# Patient Record
Sex: Female | Born: 1985 | Race: Black or African American | Hispanic: No | Marital: Married | Smoking: Never smoker
Health system: Southern US, Community
[De-identification: ages and names within clinical notes are randomized; demographics above are authoritative.]

## PROBLEM LIST (undated history)

## (undated) DIAGNOSIS — R5383 Other fatigue: Secondary | ICD-10-CM

## (undated) DIAGNOSIS — D573 Sickle-cell trait: Secondary | ICD-10-CM

## (undated) DIAGNOSIS — R51 Headache: Secondary | ICD-10-CM

## (undated) DIAGNOSIS — F40240 Claustrophobia: Secondary | ICD-10-CM

## (undated) DIAGNOSIS — R002 Palpitations: Secondary | ICD-10-CM

## (undated) DIAGNOSIS — R519 Headache, unspecified: Secondary | ICD-10-CM

## (undated) DIAGNOSIS — N6310 Unspecified lump in the right breast, unspecified quadrant: Secondary | ICD-10-CM

## (undated) HISTORY — DX: Other fatigue: R53.83

## (undated) HISTORY — PX: BREAST LUMPECTOMY: SHX2

## (undated) HISTORY — DX: Palpitations: R00.2

---

## 2010-09-16 LAB — RPR: RPR: NONREACTIVE

## 2010-09-16 LAB — HEPATITIS B SURFACE ANTIGEN: Hepatitis B Surface Ag: NEGATIVE

## 2011-01-28 NOTE — L&D Delivery Note (Signed)
Delivery Note At 3:52 PM a viable, healthy and SGA female was delivered via Vaginal, Spontaneous Delivery (Presentation: Occiput Anterior).  APGAR: 8, 9; weight 5 lb 13 oz (2635 g).   Placenta status: Intact, Spontaneous.  Cord: 3 vessels. Cervix without lacerations.  Anesthesia: Epidural  Episiotomy: None Lacerations: 2nd degree;Perineal Suture Repair: 3.0 chromic Est. Blood Loss (mL): 400  Mom to postpartum.  Baby to nursery-stable.  Gloria Yu D 03/22/2011, 8:06 PM

## 2011-03-22 ENCOUNTER — Encounter (HOSPITAL_COMMUNITY): Payer: Self-pay

## 2011-03-22 ENCOUNTER — Inpatient Hospital Stay (HOSPITAL_COMMUNITY): Payer: Medicaid Other | Admitting: Anesthesiology

## 2011-03-22 ENCOUNTER — Encounter (HOSPITAL_COMMUNITY): Payer: Self-pay | Admitting: Anesthesiology

## 2011-03-22 ENCOUNTER — Inpatient Hospital Stay (HOSPITAL_COMMUNITY)
Admission: AD | Admit: 2011-03-22 | Discharge: 2011-03-22 | Disposition: A | Discharge status: Home Health | Payer: Medicaid Other | Source: Ambulatory Visit | Attending: Obstetrics & Gynecology | Admitting: Obstetrics & Gynecology

## 2011-03-22 ENCOUNTER — Inpatient Hospital Stay (HOSPITAL_COMMUNITY)
Admission: AD | Admit: 2011-03-22 | Discharge: 2011-03-24 | DRG: 775 | Disposition: A | Discharge status: Home / Self Care | Payer: Medicaid Other | Source: Ambulatory Visit | Attending: Obstetrics & Gynecology | Admitting: Obstetrics & Gynecology

## 2011-03-22 DIAGNOSIS — O365990 Maternal care for other known or suspected poor fetal growth, unspecified trimester, not applicable or unspecified: Secondary | ICD-10-CM | POA: Diagnosis present

## 2011-03-22 DIAGNOSIS — O479 False labor, unspecified: Secondary | ICD-10-CM | POA: Insufficient documentation

## 2011-03-22 HISTORY — DX: Sickle-cell trait: D57.3

## 2011-03-22 HISTORY — DX: Unspecified lump in the right breast, unspecified quadrant: N63.10

## 2011-03-22 LAB — CBC
Hemoglobin: 13.3 g/dL (ref 12.0–15.0)
MCH: 28.7 pg (ref 26.0–34.0)
MCHC: 35.9 g/dL (ref 30.0–36.0)
RDW: 14.6 % (ref 11.5–15.5)

## 2011-03-22 LAB — RPR: RPR Ser Ql: NONREACTIVE

## 2011-03-22 MED ORDER — ZOLPIDEM TARTRATE 5 MG PO TABS: 5.0000 mg | ORAL_TABLET | Freq: Every evening | ORAL | Status: DC | PRN

## 2011-03-22 MED ORDER — EPHEDRINE 5 MG/ML INJ: 10.0000 mg | INTRAVENOUS | Status: DC | PRN

## 2011-03-22 MED ORDER — PHENYLEPHRINE 40 MCG/ML (10ML) SYRINGE FOR IV PUSH (FOR BLOOD PRESSURE SUPPORT): 80.0000 ug | PREFILLED_SYRINGE | INTRAVENOUS | Status: DC | PRN

## 2011-03-22 MED ORDER — SENNOSIDES-DOCUSATE SODIUM 8.6-50 MG PO TABS
2.0000 | ORAL_TABLET | Freq: Every day | ORAL | Status: DC
  Administered 2011-03-22: 2 via ORAL

## 2011-03-22 MED ORDER — TERBUTALINE SULFATE 1 MG/ML IJ SOLN: 0.2500 mg | Freq: Once | INTRAMUSCULAR | Status: DC | PRN

## 2011-03-22 MED ORDER — ONDANSETRON HCL 4 MG PO TABS: 4.0000 mg | ORAL_TABLET | ORAL | Status: DC | PRN

## 2011-03-22 MED ORDER — OXYTOCIN 20 UNITS IN LACTATED RINGERS INFUSION - SIMPLE
1.0000 m[IU]/min | INTRAVENOUS | Status: DC
  Administered 2011-03-22: 2 m[IU]/min via INTRAVENOUS

## 2011-03-22 MED ORDER — LACTATED RINGERS IV SOLN
500.0000 mL | INTRAVENOUS | Status: DC | PRN
  Administered 2011-03-22: 750 mL via INTRAVENOUS

## 2011-03-22 MED ORDER — IBUPROFEN 600 MG PO TABS: 600.0000 mg | ORAL_TABLET | Freq: Four times a day (QID) | ORAL | Status: DC | PRN

## 2011-03-22 MED ORDER — OXYTOCIN 20 UNITS IN LACTATED RINGERS INFUSION - SIMPLE
125.0000 mL/h | Freq: Once | INTRAVENOUS | Status: AC
  Administered 2011-03-22: 20 [IU] via INTRAVENOUS

## 2011-03-22 MED ORDER — OXYTOCIN BOLUS FROM INFUSION
500.0000 mL | Freq: Once | INTRAVENOUS | Status: DC
  Filled 2011-03-22: qty 1000
  Filled 2011-03-22: qty 500

## 2011-03-22 MED ORDER — EPHEDRINE 5 MG/ML INJ
10.0000 mg | INTRAVENOUS | Status: DC | PRN
  Filled 2011-03-22: qty 4

## 2011-03-22 MED ORDER — FLEET ENEMA 7-19 GM/118ML RE ENEM: 1.0000 | ENEMA | RECTAL | Status: DC | PRN

## 2011-03-22 MED ORDER — ACETAMINOPHEN 325 MG PO TABS: 650.0000 mg | ORAL_TABLET | ORAL | Status: DC | PRN

## 2011-03-22 MED ORDER — FENTANYL 2.5 MCG/ML BUPIVACAINE 1/10 % EPIDURAL INFUSION (WH - ANES)
INTRAMUSCULAR | Status: DC | PRN
  Administered 2011-03-22: 14 mL/h via EPIDURAL

## 2011-03-22 MED ORDER — PRENATAL MULTIVITAMIN CH
1.0000 | ORAL_TABLET | Freq: Every day | ORAL | Status: DC
  Administered 2011-03-23 – 2011-03-24 (×3): 1 via ORAL
  Filled 2011-03-22 (×2): qty 1

## 2011-03-22 MED ORDER — PENICILLIN G POTASSIUM 5000000 UNITS IJ SOLR
5.0000 10*6.[IU] | Freq: Once | INTRAVENOUS | Status: AC
  Administered 2011-03-22: 5 10*6.[IU] via INTRAVENOUS
  Filled 2011-03-22: qty 5

## 2011-03-22 MED ORDER — DIPHENHYDRAMINE HCL 25 MG PO CAPS: 25.0000 mg | ORAL_CAPSULE | Freq: Four times a day (QID) | ORAL | Status: DC | PRN

## 2011-03-22 MED ORDER — DIBUCAINE 1 % RE OINT: 1.0000 "application " | TOPICAL_OINTMENT | RECTAL | Status: DC | PRN

## 2011-03-22 MED ORDER — WITCH HAZEL-GLYCERIN EX PADS: 1.0000 "application " | MEDICATED_PAD | CUTANEOUS | Status: DC | PRN

## 2011-03-22 MED ORDER — BENZOCAINE-MENTHOL 20-0.5 % EX AERO
1.0000 "application " | INHALATION_SPRAY | CUTANEOUS | Status: DC | PRN
  Administered 2011-03-22: 1 via TOPICAL

## 2011-03-22 MED ORDER — LIDOCAINE HCL 1.5 % IJ SOLN
INTRAMUSCULAR | Status: DC | PRN
  Administered 2011-03-22 (×2): 5 mL via EPIDURAL

## 2011-03-22 MED ORDER — LIDOCAINE HCL (PF) 1 % IJ SOLN
30.0000 mL | INTRAMUSCULAR | Status: DC | PRN
  Filled 2011-03-22: qty 30

## 2011-03-22 MED ORDER — TETANUS-DIPHTH-ACELL PERTUSSIS 5-2.5-18.5 LF-MCG/0.5 IM SUSP
0.5000 mL | Freq: Once | INTRAMUSCULAR | Status: AC
  Administered 2011-03-23: 0.5 mL via INTRAMUSCULAR
  Filled 2011-03-22 (×2): qty 0.5

## 2011-03-22 MED ORDER — ONDANSETRON HCL 4 MG/2ML IJ SOLN: 4.0000 mg | INTRAMUSCULAR | Status: DC | PRN

## 2011-03-22 MED ORDER — LACTATED RINGERS IV SOLN: 500.0000 mL | Freq: Once | INTRAVENOUS | Status: DC

## 2011-03-22 MED ORDER — OXYCODONE-ACETAMINOPHEN 5-325 MG PO TABS: 1.0000 | ORAL_TABLET | ORAL | Status: DC | PRN

## 2011-03-22 MED ORDER — PENICILLIN G POTASSIUM 5000000 UNITS IJ SOLR
2.5000 10*6.[IU] | INTRAVENOUS | Status: DC
  Administered 2011-03-22: 2.5 10*6.[IU] via INTRAVENOUS
  Filled 2011-03-22 (×5): qty 2.5

## 2011-03-22 MED ORDER — IBUPROFEN 600 MG PO TABS
600.0000 mg | ORAL_TABLET | Freq: Four times a day (QID) | ORAL | Status: DC
  Administered 2011-03-22 – 2011-03-24 (×7): 600 mg via ORAL
  Filled 2011-03-22 (×6): qty 1

## 2011-03-22 MED ORDER — FENTANYL 2.5 MCG/ML BUPIVACAINE 1/10 % EPIDURAL INFUSION (WH - ANES)
14.0000 mL/h | INTRAMUSCULAR | Status: DC
  Administered 2011-03-22: 14 mL/h via EPIDURAL
  Filled 2011-03-22 (×2): qty 60

## 2011-03-22 MED ORDER — DIPHENHYDRAMINE HCL 50 MG/ML IJ SOLN: 12.5000 mg | INTRAMUSCULAR | Status: DC | PRN

## 2011-03-22 MED ORDER — OXYCODONE-ACETAMINOPHEN 5-325 MG PO TABS
1.0000 | ORAL_TABLET | ORAL | Status: DC | PRN
  Administered 2011-03-22 – 2011-03-24 (×4): 1 via ORAL
  Filled 2011-03-22 (×4): qty 1

## 2011-03-22 MED ORDER — BENZOCAINE-MENTHOL 20-0.5 % EX AERO
INHALATION_SPRAY | CUTANEOUS | Status: AC
  Administered 2011-03-22: 1 via TOPICAL
  Filled 2011-03-22: qty 56

## 2011-03-22 MED ORDER — LACTATED RINGERS IV SOLN
INTRAVENOUS | Status: DC
  Administered 2011-03-22: 11:00:00 via INTRAVENOUS

## 2011-03-22 MED ORDER — BUTORPHANOL TARTRATE 2 MG/ML IJ SOLN
1.0000 mg | INTRAMUSCULAR | Status: DC | PRN
  Administered 2011-03-22: 1 mg via INTRAVENOUS
  Filled 2011-03-22: qty 1

## 2011-03-22 MED ORDER — SIMETHICONE 80 MG PO CHEW: 80.0000 mg | CHEWABLE_TABLET | ORAL | Status: DC | PRN

## 2011-03-22 MED ORDER — LANOLIN HYDROUS EX OINT: TOPICAL_OINTMENT | CUTANEOUS | Status: DC | PRN

## 2011-03-22 MED ORDER — ONDANSETRON HCL 4 MG/2ML IJ SOLN: 4.0000 mg | Freq: Four times a day (QID) | INTRAMUSCULAR | Status: DC | PRN

## 2011-03-22 MED ORDER — CITRIC ACID-SODIUM CITRATE 334-500 MG/5ML PO SOLN: 30.0000 mL | ORAL | Status: DC | PRN

## 2011-03-22 MED ORDER — PHENYLEPHRINE 40 MCG/ML (10ML) SYRINGE FOR IV PUSH (FOR BLOOD PRESSURE SUPPORT)
80.0000 ug | PREFILLED_SYRINGE | INTRAVENOUS | Status: DC | PRN
  Filled 2011-03-22: qty 5

## 2011-03-22 NOTE — Progress Notes (Signed)
Per Dr Arlyce Dice, no amniotic bag felt, attempted, but unable to AROM. Scant amt of clear fluid noted.  Will assume that pt had, in-fact, SROM'd last night around 2230.

## 2011-03-22 NOTE — H&P (Signed)
26 y.o. G2P0010  Estimated Date of Delivery: 03/22/11 admitted at [redacted] weeks gestation in early labor. Prenatal course was uncomplicated. Prenatal labs: Blood Type:A+.  Screening tests for HIV, Syphilis, Hepatitis B, Rubella sensitivity, fetal anomalies, and gestational diabetes were negative.   Perineal group B strep colonization was noted in her first trimester urine screen.  Afebrile, BP 140/90 range. Heart and Lungs: No active disease Abdomen: soft, gravid, EFW AGA. Cervical exam:  2/80.  Impression: Term Preg., early labor, mild elevation of blood pressure  Plan: Labor augmentation with pitocin, GBS prophylaxis

## 2011-03-22 NOTE — Anesthesia Postprocedure Evaluation (Signed)
Anesthesia Post Note  Patient: Gloria Yu  Procedure(s) Performed: * No procedures listed *  Anesthesia type: Epidural  Patient location: Mother/Baby  Post pain: Pain level controlled  Post assessment: Post-op Vital signs reviewed  Last Vitals:  Filed Vitals:   03/22/11 1840  BP: 109/74  Pulse: 95  Temp: 36.8 C  Resp: 18    Post vital signs: Reviewed  Level of consciousness: awake  Complications: No apparent anesthesia complications

## 2011-03-22 NOTE — Progress Notes (Signed)
Rec'd report & assumed pt care 

## 2011-03-22 NOTE — Progress Notes (Signed)
Dr Arlyce Dice notified of patient c/o painful contractions, nausea, tracing, ctx pattern, sve result and bp readings. He will place order to admit to l/d unit

## 2011-03-22 NOTE — Progress Notes (Signed)
Patient is here with c/o leaking fluids and mucus plug. Reports good fetal movement. Denies any vaginal bleeding

## 2011-03-22 NOTE — Progress Notes (Signed)
Patient returned with c/o abdominal pain (states;"possible conatrctions"), n/v and was unable to sleep since she left here. She reports good fetal movement, denies vaginal bleeding or lof.

## 2011-03-22 NOTE — Anesthesia Preprocedure Evaluation (Signed)
Anesthesia Evaluation  Patient identified by MRN, date of birth, ID band Patient awake    Reviewed: Allergy & Precautions, H&P , NPO status , Patient's Chart, lab work & pertinent test results  Airway Mallampati: III TM Distance: >3 FB Neck ROM: full    Dental No notable dental hx.    Pulmonary neg pulmonary ROS,    Pulmonary exam normal       Cardiovascular neg cardio ROS     Neuro/Psych Negative Neurological ROS  Negative Psych ROS   GI/Hepatic negative GI ROS, Neg liver ROS,   Endo/Other  Negative Endocrine ROS  Renal/GU negative Renal ROS  Genitourinary negative   Musculoskeletal negative musculoskeletal ROS (+)   Abdominal (+) obese,   Peds negative pediatric ROS (+)  Hematology negative hematology ROS (+)   Anesthesia Other Findings   Reproductive/Obstetrics (+) Pregnancy                           Anesthesia Physical Anesthesia Plan  ASA: III  Anesthesia Plan: Epidural   Post-op Pain Management:    Induction:   Airway Management Planned:   Additional Equipment:   Intra-op Plan:   Post-operative Plan:   Informed Consent: I have reviewed the patients History and Physical, chart, labs and discussed the procedure including the risks, benefits and alternatives for the proposed anesthesia with the patient or authorized representative who has indicated his/her understanding and acceptance.     Plan Discussed with:   Anesthesia Plan Comments:         Anesthesia Quick Evaluation

## 2011-03-22 NOTE — Progress Notes (Signed)
States she prefers to bottlefeed. On admission, said she planned to breastfeed.   When asked why she changed her mind, stated that she never wanted to breastfeed, but that she'd felt pressured to do so.  Reviewed benefits of breastmilk, which she was already aware of.  Still prefers to bottlefeed.

## 2011-03-22 NOTE — Progress Notes (Signed)
Notified of tracing, ctx pattern, sve result and negative fern. Order to discharge patient home after 2 15x15 accels.

## 2011-03-22 NOTE — Anesthesia Procedure Notes (Signed)
Epidural Patient location during procedure: OB Start time: 03/22/2011 10:00 AM End time: 03/22/2011 10:04 AM Reason for block: procedure for pain  Staffing Anesthesiologist: Sandrea Hughs Performed by: anesthesiologist   Preanesthetic Checklist Completed: patient identified, site marked, surgical consent, pre-op evaluation, timeout performed, IV checked, risks and benefits discussed and monitors and equipment checked  Epidural Patient position: sitting Prep: site prepped and draped and DuraPrep Patient monitoring: continuous pulse ox and blood pressure Approach: midline Injection technique: LOR air  Needle:  Needle type: Tuohy  Needle gauge: 17 G Needle length: 9 cm Needle insertion depth: 7 cm Catheter type: closed end flexible Catheter size: 19 Gauge Catheter at skin depth: 13 cm Test dose: negative and 1.5% lidocaine  Assessment Sensory level: T8 Events: blood not aspirated, injection not painful, no injection resistance, negative IV test and no paresthesia

## 2011-03-23 LAB — CBC
MCH: 28.2 pg (ref 26.0–34.0)
MCV: 80.5 fL (ref 78.0–100.0)
Platelets: 223 10*3/uL (ref 150–400)
RDW: 14.6 % (ref 11.5–15.5)
WBC: 10.6 10*3/uL — ABNORMAL HIGH (ref 4.0–10.5)

## 2011-03-23 NOTE — Progress Notes (Signed)
Post Partum Day 1 Subjective: no complaints  Objective: Blood pressure 96/64, pulse 94, temperature 98.4 F (36.9 C), temperature source Oral, resp. rate 20, height 5\' 3"  (1.6 m), weight 258 lb (117.028 kg), SpO2 98.00%, unknown if currently breastfeeding.  Physical Exam:  General: alert Lochia: appropriate Uterine Fundus: firm   Basename 03/23/11 0525 03/22/11 0848  HGB 11.1* 13.3  HCT 31.7* 37.0    Assessment/Plan: Plan for discharge tomorrow   LOS: 1 day   Estill Llerena D 03/23/2011, 9:47 AM

## 2011-03-23 NOTE — Anesthesia Postprocedure Evaluation (Signed)
  Anesthesia Post-op Note  Patient: Gloria Yu  Procedure(s) Performed: * No procedures listed *  Patient Location: Mother/Baby  Anesthesia Type: Epidural  Level of Consciousness: awake, alert  and oriented  Airway and Oxygen Therapy: Patient Spontanous Breathing  Post-op Pain: mild  Post-op Assessment: Post-op Vital signs reviewed  Post-op Vital Signs: stable  Complications: No apparent anesthesia complications

## 2011-03-23 NOTE — Addendum Note (Signed)
Addendum  created 03/23/11 1042 by Edison Pace, CRNA   Modules edited:Charges VN, Notes Section

## 2011-03-24 MED ORDER — IBUPROFEN 600 MG PO TABS: 600.0000 mg | ORAL_TABLET | Freq: Four times a day (QID) | ORAL | Status: AC

## 2011-03-24 NOTE — Discharge Summary (Signed)
Obstetric Discharge Summary Reason for Admission: onset of labor Prenatal Procedures: none Intrapartum Procedures: spontaneous vaginal delivery Postpartum Procedures: none Complications-Operative and Postpartum: none Hemoglobin  Date Value Range Status  03/23/2011 11.1* 12.0-15.0 (g/dL) Final     HCT  Date Value Range Status  03/23/2011 31.7* 36.0-46.0 (%) Final    Discharge Diagnoses: Term Pregnancy-delivered  Discharge Information: Date: 03/24/2011 Activity: pelvic rest Diet: routine Medications: PNV and Ibuprofen Condition: stable Instructions: refer to practice specific booklet Discharge to: home Follow-up Information    Follow up with Mickel Baas, MD in 4 weeks.   Contact information:   [address] [place] [address] [phone]          Newborn Data: Live born female  Birth Weight: 5 lb 13 oz (2635 g) APGAR: 8, 9  Home with mother.  Gloria Yu 03/24/2011, 9:39 AM

## 2011-03-24 NOTE — Progress Notes (Signed)
UR chart review completed.  

## 2011-03-24 NOTE — Progress Notes (Signed)
FOB was told several times during the shift to not sleep with infant due to possible injury also encouraged room light be left on so infants  color could be assessed. Patient and FOB continue to sleep with all the lights off. While making rounds at this time FOB  had placed infant on stomach to sleep stating "He does not like to sleep on his back" FOB again given information regarding safety issues with infant sleeping on stomach.

## 2011-06-10 ENCOUNTER — Other Ambulatory Visit: Payer: Self-pay | Admitting: Internal Medicine

## 2011-06-13 ENCOUNTER — Other Ambulatory Visit: Payer: Medicaid Other

## 2011-06-16 ENCOUNTER — Other Ambulatory Visit: Payer: Medicaid Other

## 2011-06-18 ENCOUNTER — Ambulatory Visit
Admission: RE | Admit: 2011-06-18 | Discharge: 2011-06-18 | Disposition: A | Discharge status: Home / Self Care | Payer: 59 | Source: Ambulatory Visit | Attending: Internal Medicine | Admitting: Internal Medicine

## 2013-11-28 ENCOUNTER — Encounter (HOSPITAL_COMMUNITY): Payer: Self-pay

## 2015-04-17 DIAGNOSIS — D069 Carcinoma in situ of cervix, unspecified: Secondary | ICD-10-CM

## 2015-04-17 HISTORY — DX: Carcinoma in situ of cervix, unspecified: D06.9

## 2015-07-05 DIAGNOSIS — Z9889 Other specified postprocedural states: Secondary | ICD-10-CM

## 2015-07-05 HISTORY — DX: Other specified postprocedural states: Z98.890

## 2015-10-29 ENCOUNTER — Encounter (HOSPITAL_COMMUNITY): Payer: Self-pay

## 2015-10-29 ENCOUNTER — Emergency Department (HOSPITAL_COMMUNITY)
Admission: EM | Admit: 2015-10-29 | Discharge: 2015-10-29 | Disposition: A | Discharge status: Home / Self Care | Payer: Medicaid Other | Attending: Emergency Medicine | Admitting: Emergency Medicine

## 2015-10-29 DIAGNOSIS — Z91040 Latex allergy status: Secondary | ICD-10-CM | POA: Diagnosis not present

## 2015-10-29 DIAGNOSIS — L509 Urticaria, unspecified: Secondary | ICD-10-CM | POA: Diagnosis not present

## 2015-10-29 MED ORDER — PREDNISONE 20 MG PO TABS: 40.0000 mg | ORAL_TABLET | Freq: Every day | ORAL | 0 refills | Status: AC

## 2015-10-29 MED ORDER — CETIRIZINE HCL 10 MG PO TABS: 10.0000 mg | ORAL_TABLET | Freq: Every day | ORAL | 0 refills | Status: DC

## 2015-10-29 MED ORDER — PREDNISONE 20 MG PO TABS
60.0000 mg | ORAL_TABLET | Freq: Once | ORAL | Status: AC
  Administered 2015-10-29: 60 mg via ORAL
  Filled 2015-10-29: qty 3

## 2015-10-29 MED ORDER — LORATADINE 10 MG PO TABS
10.0000 mg | ORAL_TABLET | Freq: Every day | ORAL | Status: DC
  Administered 2015-10-29: 10 mg via ORAL
  Filled 2015-10-29: qty 1

## 2015-10-29 NOTE — ED Triage Notes (Signed)
Pt came in contact with latex and has had a rash for two days that will not go away on her arms and chest. Pt has taken benadryl with little relief.

## 2015-10-29 NOTE — Discharge Instructions (Signed)
Read the information below.  You are being prescribed a short course of steroids and cetirizine. Cetirizine works similarly to benadryl, however it will not make you drowsy and you only have to take it once daily. Do not take cetirizine and benadryl together.  Be sure to follow up with your primary doctor later this week for re-evaluation.  Use the prescribed medication as directed.  Please discuss all new medications with your pharmacist.   You may return to the Emergency Department at any time for worsening condition or any new symptoms that concern you. Return to ED if you develop fever, oral swelling, throat tightening, chest tightness, difficulty breathing, difficulty swallowing, abdominal pain, vomiting, or syncope.

## 2015-10-29 NOTE — ED Provider Notes (Signed)
[facility] DEPT Provider Note   CSN: [id number] Arrival date & time: 10/29/15  0530     History   Chief Complaint Chief Complaint  Patient presents with  . Urticaria    HPI Gloria Yu is a 30 y.o. female.  Gloria Yu is a 30 y.o. female with history of sickle cell trait presents to ED with complaint of urticaria. Patient reports allergy to latex, approximately two days ago she was exposed to latex at a birthday party. She presents to ED with complaint of rash to chest, neck, and arms. She has associated pruritis affecting her sleep. She denies fever, oral lesions, oral swelling, hoarseness, throat tightness, trouble swallowing, trouble breathing, chest tightness, chest pain, abdominal pain, vomiting, lightheadedness, or headache. She tried benadryl at home with minimal relief.       Past Medical History:  Diagnosis Date  . Lump of breast, right    removed at age 74  . Sickle cell trait (Grandfather)     There are no active problems to display for this patient.   Past Surgical History:  Procedure Laterality Date  . BREAST LUMPECTOMY      OB History    Gravida Para Term Preterm AB Living   2 1 1   1 1    SAB TAB Ectopic Multiple Live Births   1       1       Home Medications    Prior to Admission medications   Medication Sig Start Date End Date Taking? Authorizing Provider  cetirizine (ZYRTEC) 10 MG tablet Take 1 tablet (10 mg total) by mouth daily. 10/29/15   Roxanna Mew, PA-C  predniSONE (DELTASONE) 20 MG tablet Take 2 tablets (40 mg total) by mouth daily with breakfast. 10/29/15 11/02/15  Roxanna Mew, PA-C    Family History History reviewed. No pertinent family history.  Social History Social History  Substance Use Topics  . Smoking status: Never Smoker  . Smokeless tobacco: Never Used  . Alcohol use No     Allergies   Shellfish allergy; Aspirin; and Latex   Review of Systems Review of Systems  Constitutional: Negative for  fever.  HENT: Negative for mouth sores and trouble swallowing.   Eyes: Positive for itching.  Respiratory: Negative for chest tightness and shortness of breath.   Cardiovascular: Negative for chest pain.  Gastrointestinal: Negative for abdominal pain, nausea and vomiting.  Genitourinary: Negative for dysuria and hematuria.  Musculoskeletal: Negative for arthralgias and myalgias.  Skin: Positive for rash.  Neurological: Negative for light-headedness and headaches.     Physical Exam Updated Vital Signs BP 117/86 (BP Location: Right Arm)   Pulse 91   Temp 97.7 F (36.5 C) (Oral)   Resp 16   Ht 5\' 3"  (1.6 m)   Wt 128.4 kg   LMP 10/22/2015 (Exact Date)   SpO2 98%   BMI 50.13 kg/m   Physical Exam  Constitutional: She appears well-developed and well-nourished. No distress.  HENT:  Head: Normocephalic and atraumatic.  Mouth/Throat: Uvula is midline, oropharynx is clear and moist and mucous membranes are normal. No oral lesions. No trismus in the jaw. No uvula swelling. No oropharyngeal exudate or posterior oropharyngeal edema.  No trismus. No uvula swelling. No posterior oropharyngeal swelling. No sublingual swelling.   Eyes: Conjunctivae and EOM are normal. Pupils are equal, round, and reactive to light. Right eye exhibits no discharge. Left eye exhibits no discharge. No scleral icterus.  Neck: Normal range of motion and phonation normal.  Neck supple. No neck rigidity. Normal range of motion present.  No nuchal rigidity.   Cardiovascular: Normal rate, regular rhythm, normal heart sounds and intact distal pulses.   No murmur heard. Pulmonary/Chest: Effort normal and breath sounds normal. No stridor. No respiratory distress. She has no wheezes. She has no rales.  Abdominal: Soft. Bowel sounds are normal. She exhibits no distension. There is no tenderness. There is no rigidity, no rebound, no guarding and no CVA tenderness.  Musculoskeletal: Normal range of motion.  Lymphadenopathy:     She has no cervical adenopathy.  Neurological: She is alert. She is not disoriented. Coordination and gait normal. GCS eye subscore is 4. GCS verbal subscore is 5. GCS motor subscore is 6.  Skin: Skin is warm and dry. Rash noted. Rash is papular. She is not diaphoretic.  Diffuse mildly erythematous papules and plaques noted to arms and chest.   Psychiatric: She has a normal mood and affect. Her behavior is normal.     ED Treatments / Results  Labs (all labs ordered are listed, but only abnormal results are displayed) Labs Reviewed - No data to display  EKG  EKG Interpretation None       Radiology No results found.  Procedures Procedures (including critical care time)  Medications Ordered in ED Medications  loratadine (CLARITIN) tablet 10 mg (10 mg Oral Given 10/29/15 0715)  predniSONE (DELTASONE) tablet 60 mg (60 mg Oral Given 10/29/15 0715)     Initial Impression / Assessment and Plan / ED Course  I have reviewed the triage vital signs and the nursing notes.  Pertinent labs & imaging results that were available during my care of the patient were reviewed by me and considered in my medical decision making (see chart for details).  Clinical Course    Patient presents to ED with complaint of rash following exposure to latex. No latex allergy. Patient is afebrile and non-toxic appearing in NAD. VSS. She is reclining comfortably in bed. Denies trouble swallowing or trouble breathing. No trismus, no oral lesions, oropharynx edema, uvula edema. Managing oral secretions. No nuchal rigidity. No stridor. Lungs are clear to auscultation.  Abdomen soft and non-tender.   Patient most likely with urticarial eruption. Possible trigger of latex balloons. No signs of anaphylactic reaction. Will treat with antihistamines and short PO steroid course. Follow up with PCP in 2-3 days. Return precautions discussed. Pt voiced understanding and is agreeable. Pt is safe for discharge at this time.      Final Clinical Impressions(s) / ED Diagnoses   Final diagnoses:  Urticaria    New Prescriptions Discharge Medication List as of 10/29/2015  6:56 AM    START taking these medications   Details  cetirizine (ZYRTEC) 10 MG tablet Take 1 tablet (10 mg total) by mouth daily., Starting Mon 10/29/2015, Print    predniSONE (DELTASONE) 20 MG tablet Take 2 tablets (40 mg total) by mouth daily with breakfast., Starting Mon 10/29/2015, Until Fri 11/02/2015, [place], PA-C 10/29/15 [id number]    Varney Biles, MD 10/30/15 [id number]

## 2016-03-06 ENCOUNTER — Emergency Department (HOSPITAL_COMMUNITY)
Admission: EM | Admit: 2016-03-06 | Discharge: 2016-03-06 | Disposition: A | Discharge status: Home / Self Care | Payer: Medicaid Other | Attending: Emergency Medicine | Admitting: Emergency Medicine

## 2016-03-06 ENCOUNTER — Encounter (HOSPITAL_COMMUNITY): Payer: Self-pay

## 2016-03-06 DIAGNOSIS — M546 Pain in thoracic spine: Secondary | ICD-10-CM | POA: Insufficient documentation

## 2016-03-06 DIAGNOSIS — Y990 Civilian activity done for income or pay: Secondary | ICD-10-CM | POA: Insufficient documentation

## 2016-03-06 DIAGNOSIS — Y939 Activity, unspecified: Secondary | ICD-10-CM | POA: Diagnosis not present

## 2016-03-06 DIAGNOSIS — X500XXA Overexertion from strenuous movement or load, initial encounter: Secondary | ICD-10-CM | POA: Insufficient documentation

## 2016-03-06 DIAGNOSIS — Y929 Unspecified place or not applicable: Secondary | ICD-10-CM | POA: Diagnosis not present

## 2016-03-06 DIAGNOSIS — Z91040 Latex allergy status: Secondary | ICD-10-CM | POA: Diagnosis not present

## 2016-03-06 LAB — URINALYSIS, ROUTINE W REFLEX MICROSCOPIC
BILIRUBIN URINE: NEGATIVE
Glucose, UA: NEGATIVE mg/dL
HGB URINE DIPSTICK: NEGATIVE
Ketones, ur: NEGATIVE mg/dL
Leukocytes, UA: NEGATIVE
Nitrite: NEGATIVE
PH: 6 (ref 5.0–8.0)
Protein, ur: NEGATIVE mg/dL
SPECIFIC GRAVITY, URINE: 1.023 (ref 1.005–1.030)

## 2016-03-06 LAB — POC URINE PREG, ED: Preg Test, Ur: NEGATIVE

## 2016-03-06 MED ORDER — METHOCARBAMOL 500 MG PO TABS: 500.0000 mg | ORAL_TABLET | Freq: Two times a day (BID) | ORAL | 0 refills | Status: DC

## 2016-03-06 MED ORDER — CYCLOBENZAPRINE HCL 10 MG PO TABS
10.0000 mg | ORAL_TABLET | Freq: Once | ORAL | Status: AC
  Administered 2016-03-06: 10 mg via ORAL
  Filled 2016-03-06: qty 1

## 2016-03-06 NOTE — ED Triage Notes (Signed)
Pt complaining of R mid/lower back pain. Pt states increased pain with coughing. Pt denies any fall/injury. Pt denies any urinary symptoms.

## 2016-03-06 NOTE — Discharge Instructions (Signed)
Do not drive while taking the muscle relaxant as it will make yo sleepy. You may continue to take the ibuprofen in addition for inflammation. Follow up with Dr. Erlinda Hong if symptoms persist.

## 2016-03-06 NOTE — ED Provider Notes (Signed)
[facility] DEPT Provider Note   CSN: [id number] Arrival date & time: 03/06/16  [id number]   By signing my name below, I, [name], attest that this documentation has been prepared under the direction and in the presence of [facility] [place]. Electronically Signed: [name], Scribe. 03/06/16. 7:22 PM.   History   Chief Complaint Chief Complaint  Patient presents with  . Back Pain   The history is provided by the patient and medical records. No language interpreter was used.    HPI Comments: Gloria Yu is a 31 y.o. female who presents to the Emergency Department complaining of gradually worsening right sided back pain x 2 weeks. She adds pain is exacerbated with standing up straight, movement and coughing. 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 She adds ibuprofen does not provide relief, and she last took 200 mg today ~5 hours ago. Pt denies injury, urinary frequency, dysuria, abdominal pain, fever and chills.  Past Medical History:  Diagnosis Date  . Lump of breast, right    removed at age 31  . Sickle cell trait ([place])     There are no active problems to display for this patient.   Past Surgical History:  Procedure Laterality Date  . BREAST LUMPECTOMY      OB History    Gravida Para Term Preterm AB Living   2 1 1   1 1    SAB TAB Ectopic Multiple Live Births   1       1       Home Medications    Prior to Admission medications   Medication Sig Start Date End Date Taking? Authorizing Provider  cetirizine (ZYRTEC) 10 MG tablet Take 1 tablet (10 mg total) by mouth daily. 10/29/15   Roxanna Mew, PA-C  methocarbamol (ROBAXIN) 500 MG tablet Take 1 tablet (500 mg total) by mouth 2 (two) times daily. 03/06/16   [place], NP    Family History History reviewed. No pertinent family history.  Social History Social History  Substance Use Topics  . Smoking status: Never Smoker  . Smokeless tobacco: Never Used  . Alcohol use No      Allergies   Shellfish allergy; Aspirin; and Latex   Review of Systems Review of Systems  Constitutional: Negative for chills and fever.  Gastrointestinal: Negative for abdominal pain.  Genitourinary: Negative for dysuria, frequency and urgency.  Musculoskeletal: Positive for back pain and myalgias.  All other systems reviewed and are negative.    Physical Exam Updated Vital Signs BP 145/69   Pulse 82   Temp 98.5 F (36.9 C) (Oral)   Resp 16   LMP 03/03/2016 (Exact Date)   SpO2 100%   Physical Exam  Constitutional: She is oriented to person, place, and time. Vital signs are normal. She appears well-developed and well-nourished.  Non-toxic appearance. No distress.  Obese Afebrile, nontoxic, NAD  HENT:  Head: Normocephalic and atraumatic.  Right Ear: Tympanic membrane normal.  Left Ear: Tympanic membrane normal.  Nose: Nose normal.  Mouth/Throat: Uvula is midline, oropharynx is clear and moist and mucous membranes are normal.  Eyes: Conjunctivae and EOM are normal. Right eye exhibits no discharge. Left eye exhibits no discharge.  Neck: Normal range of motion. Neck supple.  Cardiovascular: Normal rate and regular rhythm.   Pulses:      Radial pulses are 2+ on the right side, and 2+ on the left side.  Adequate circulation.  Pulmonary/Chest: Effort normal and breath  sounds normal.  Abdominal: Soft. Normal appearance and bowel sounds are normal. There is no tenderness.  Musculoskeletal: Normal range of motion.       Thoracic back: She exhibits tenderness and spasm. She exhibits no bony tenderness and no deformity. Decreased range of motion: due to pain.  Grip strength equal bilaterally. Pt tender to the right thoracic area; pain with range of motion; steady gait; -foot drag.  Neurological: She is alert and oriented to person, place, and time. She has normal strength. No sensory deficit. Gait normal.  Reflex Scores:      Bicep reflexes are 2+ on the right side and 2+ on  the left side.      Brachioradialis reflexes are 2+ on the right side and 2+ on the left side.      Patellar reflexes are 2+ on the right side and 2+ on the left side. Reflexes symmetric and normal.  Skin: Skin is warm, dry and intact. No rash noted.  Psychiatric: She has a normal mood and affect. Her behavior is normal.  Nursing note and vitals reviewed.    ED Treatments / Results  DIAGNOSTIC STUDIES: Oxygen Saturation is 100% on RA, normal by my interpretation.    COORDINATION OF CARE: 7:21 PM Discussed treatment plan with pt at bedside and pt agreed to plan. Will order pain medication and muscle relaxant.  Procedures Procedures (including critical care time)  Medications Ordered in ED Medications  cyclobenzaprine (FLEXERIL) tablet 10 mg (10 mg Oral Given 03/06/16 2006)     Initial Impression / Assessment and Plan / ED Course  I have reviewed the triage vital signs and the nursing notes.  I personally performed the services described in this documentation, which was scribed in my presence. The recorded information has been reviewed and is accurate.   Final Clinical Impressions(s) / ED Diagnoses  31 y.o. female with back pain stable for d/c without UTI or neuro deficits. Will treat with muscle relaxants and she will continue to take ibuprofen. She will f/u with Dr. Erlinda Hong if symptoms persist.  Final diagnoses:  Acute right-sided thoracic back pain    New Prescriptions New Prescriptions   METHOCARBAMOL (ROBAXIN) 500 MG TABLET    Take 1 tablet (500 mg total) by mouth 2 (two) times daily.     [place], [place] 03/06/16 [place], MD 03/10/16 [phone]

## 2016-06-03 DIAGNOSIS — O021 Missed abortion: Secondary | ICD-10-CM | POA: Insufficient documentation

## 2016-08-09 ENCOUNTER — Emergency Department (HOSPITAL_COMMUNITY): Payer: Medicaid Other

## 2016-08-09 ENCOUNTER — Emergency Department (HOSPITAL_COMMUNITY)
Admission: EM | Admit: 2016-08-09 | Discharge: 2016-08-09 | Disposition: A | Discharge status: Home / Self Care | Payer: Medicaid Other | Attending: Emergency Medicine | Admitting: Emergency Medicine

## 2016-08-09 ENCOUNTER — Encounter (HOSPITAL_COMMUNITY): Payer: Self-pay | Admitting: Emergency Medicine

## 2016-08-09 DIAGNOSIS — Z91040 Latex allergy status: Secondary | ICD-10-CM | POA: Diagnosis not present

## 2016-08-09 DIAGNOSIS — G43909 Migraine, unspecified, not intractable, without status migrainosus: Secondary | ICD-10-CM | POA: Insufficient documentation

## 2016-08-09 DIAGNOSIS — H53132 Sudden visual loss, left eye: Secondary | ICD-10-CM | POA: Diagnosis present

## 2016-08-09 DIAGNOSIS — G43109 Migraine with aura, not intractable, without status migrainosus: Secondary | ICD-10-CM

## 2016-08-09 DIAGNOSIS — D573 Sickle-cell trait: Secondary | ICD-10-CM | POA: Insufficient documentation

## 2016-08-09 DIAGNOSIS — J320 Chronic maxillary sinusitis: Secondary | ICD-10-CM

## 2016-08-09 LAB — CBC WITH DIFFERENTIAL/PLATELET
BASOS PCT: 1 %
Basophils Absolute: 0 10*3/uL (ref 0.0–0.1)
EOS ABS: 0 10*3/uL (ref 0.0–0.7)
EOS PCT: 1 %
HCT: 37.2 % (ref 36.0–46.0)
Hemoglobin: 13 g/dL (ref 12.0–15.0)
LYMPHS ABS: 1.9 10*3/uL (ref 0.7–4.0)
Lymphocytes Relative: 46 %
MCH: 27.1 pg (ref 26.0–34.0)
MCHC: 34.9 g/dL (ref 30.0–36.0)
MCV: 77.5 fL — ABNORMAL LOW (ref 78.0–100.0)
Monocytes Absolute: 0.3 10*3/uL (ref 0.1–1.0)
Monocytes Relative: 7 %
Neutro Abs: 1.9 10*3/uL (ref 1.7–7.7)
Neutrophils Relative %: 45 %
PLATELETS: 323 10*3/uL (ref 150–400)
RBC: 4.8 MIL/uL (ref 3.87–5.11)
RDW: 13.8 % (ref 11.5–15.5)
WBC: 4.2 10*3/uL (ref 4.0–10.5)

## 2016-08-09 LAB — BASIC METABOLIC PANEL
Anion gap: 7 (ref 5–15)
BUN: 8 mg/dL (ref 6–20)
CO2: 24 mmol/L (ref 22–32)
Calcium: 9.1 mg/dL (ref 8.9–10.3)
Chloride: 106 mmol/L (ref 101–111)
Creatinine, Ser: 0.67 mg/dL (ref 0.44–1.00)
Glucose, Bld: 88 mg/dL (ref 65–99)
POTASSIUM: 4 mmol/L (ref 3.5–5.1)
SODIUM: 137 mmol/L (ref 135–145)

## 2016-08-09 LAB — I-STAT BETA HCG BLOOD, ED (MC, WL, AP ONLY)

## 2016-08-09 MED ORDER — LORAZEPAM 2 MG/ML IJ SOLN
1.0000 mg | Freq: Once | INTRAMUSCULAR | Status: AC
  Administered 2016-08-09: 1 mg via INTRAVENOUS
  Filled 2016-08-09: qty 1

## 2016-08-09 NOTE — ED Notes (Signed)
Pt laying calmly in stretcher, NAD, no needs

## 2016-08-09 NOTE — Discharge Instructions (Signed)
Return for any new or worse symptoms. MRI was negative. Follow-up with neurology. Clinically think this is a complicated migraine. Labs were normal.

## 2016-08-09 NOTE — ED Notes (Signed)
Pt returned from MRI. Pt repositioned, warm blankets provided. Family at bedside.

## 2016-08-09 NOTE — ED Notes (Signed)
Patient states she had a sudden loss of vision in her left eye on Wed. 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 10 mins. States she had a headache after. C/o cramps in her legs. States that happens everyday and her eyes are dry.

## 2016-08-09 NOTE — ED Provider Notes (Addendum)
[facility] DEPT Provider Note   CSN: [id number] Arrival date & time: 08/09/16  7711     History   Chief Complaint Chief Complaint  Patient presents with  . Loss of Vision  . Leg Pain    HPI Gloria Yu is a 31 y.o. female.  Patient given a complaint of eyes feeling dry her leg sibling cramping really bad and on Tuesday she had left eye vision loss and then immediately had a fairly significant headache. Although symptoms have now resolved. Patient has had a history of headaches in the past felt to be may be migraine light. Ms. never had any focal deficits with it. Other than feeling a little fatigued in the leg cramps patient now feels fine.      Past Medical History:  Diagnosis Date  . Lump of breast, right    removed at age 53  . Sickle cell trait ([place])     There are no active problems to display for this patient.   Past Surgical History:  Procedure Laterality Date  . BREAST LUMPECTOMY      OB History    Gravida Para Term Preterm AB Living   2 1 1   1 1    SAB TAB Ectopic Multiple Live Births   1       1       Home Medications    Prior to Admission medications   Medication Sig Start Date End Date Taking? Authorizing Provider  cetirizine (ZYRTEC) 10 MG tablet Take 1 tablet (10 mg total) by mouth daily. 10/29/15   Frederica Kuster, PA-C  methocarbamol (ROBAXIN) 500 MG tablet Take 1 tablet (500 mg total) by mouth 2 (two) times daily. 03/06/16   Ashley Murrain, NP    Family History No family history on file.  Social History Social History  Substance Use Topics  . Smoking status: Never Smoker  . Smokeless tobacco: Never Used  . Alcohol use No     Allergies   Shellfish allergy; Aspirin; and Latex   Review of Systems Review of Systems  Constitutional: Negative for fever.  HENT: Negative for congestion.   Eyes: Positive for visual disturbance.  Respiratory: Negative for shortness of breath.   Cardiovascular: Negative for chest pain.    Gastrointestinal: Negative for abdominal pain.  Genitourinary: Negative for dysuria.  Musculoskeletal: Negative for back pain.  Skin: Negative for rash.  Neurological: Positive for numbness and headaches.  Hematological: Does not bruise/bleed easily.  Psychiatric/Behavioral: Negative for confusion.     Physical Exam Updated Vital Signs BP 116/65 (BP Location: Right Arm)   Pulse (!) 103   Temp 98.5 F (36.9 C) (Oral)   Resp 16   LMP 08/07/2016   SpO2 99%   Physical Exam  Constitutional: She is oriented to person, place, and time. She appears well-developed and well-nourished. No distress.  HENT:  Head: Normocephalic and atraumatic.  Eyes: Pupils are equal, round, and reactive to light. Conjunctivae and EOM are normal.  Neck: Normal range of motion. Neck supple.  Cardiovascular: Normal rate and regular rhythm.   Pulmonary/Chest: Effort normal and breath sounds normal. No respiratory distress.  Abdominal: Soft. Bowel sounds are normal. There is no tenderness.  Musculoskeletal: Normal range of motion.  Neurological: She is alert and oriented to person, place, and time. No cranial nerve deficit or sensory deficit. She exhibits normal muscle tone. Coordination normal.  Skin: Skin is warm.  Nursing note and vitals reviewed.    ED Treatments / Results  Labs (all labs ordered are listed, but only abnormal results are displayed) Labs Reviewed  CBC WITH DIFFERENTIAL/PLATELET - Abnormal; Notable for the following:       Result Value   MCV 77.5 (*)    All other components within normal limits  BASIC METABOLIC PANEL  I-STAT BETA HCG BLOOD, ED ([facility], [facility], [facility] ONLY)   Results for orders placed or performed during the hospital encounter of 08/09/16  CBC with Differential/Platelet  Result Value Ref Range   WBC 4.2 4.0 - 10.5 K/uL   RBC 4.80 3.87 - 5.11 MIL/uL   Hemoglobin 13.0 12.0 - 15.0 g/dL   HCT 37.2 36.0 - 46.0 %   MCV 77.5 (L) 78.0 - 100.0 fL   MCH 27.1 26.0 - 34.0 pg   MCHC  34.9 30.0 - 36.0 g/dL   RDW 13.8 11.5 - 15.5 %   Platelets 323 150 - 400 K/uL   Neutrophils Relative % 45 %   Neutro Abs 1.9 1.7 - 7.7 K/uL   Lymphocytes Relative 46 %   Lymphs Abs 1.9 0.7 - 4.0 K/uL   Monocytes Relative 7 %   Monocytes Absolute 0.3 0.1 - 1.0 K/uL   Eosinophils Relative 1 %   Eosinophils Absolute 0.0 0.0 - 0.7 K/uL   Basophils Relative 1 %   Basophils Absolute 0.0 0.0 - 0.1 K/uL  Basic metabolic panel  Result Value Ref Range   Sodium 137 135 - 145 mmol/L   Potassium 4.0 3.5 - 5.1 mmol/L   Chloride 106 101 - 111 mmol/L   CO2 24 22 - 32 mmol/L   Glucose, Bld 88 65 - 99 mg/dL   BUN 8 6 - 20 mg/dL   Creatinine, Ser 0.67 0.44 - 1.00 mg/dL   Calcium 9.1 8.9 - 10.3 mg/dL   GFR calc non Af Amer >60 >60 mL/min   GFR calc Af Amer >60 >60 mL/min   Anion gap 7 5 - 15  I-Stat beta hCG blood, ED  Result Value Ref Range   I-stat hCG, quantitative <5.0 <5 mIU/mL   Comment 3            EKG  EKG Interpretation None       Radiology Mr Brain Wo Contrast (neuro Protocol)  Result Date: 08/09/2016 CLINICAL DATA:  Episode of left-sided vision loss. Dry eyes. Bilateral lower extremity cramping for 2 days. Vision is restored to normal. EXAM: MRI HEAD WITHOUT CONTRAST TECHNIQUE: Multiplanar, multiecho pulse sequences of the brain and surrounding structures were obtained without intravenous contrast. COMPARISON:  MRI brain 06/18/2011. FINDINGS: Brain: No acute infarct, hemorrhage, or mass lesion is present. The ventricles are of normal size. No significant extraaxial fluid collection is present. No significant white matter disease is present. The internal auditory canals are within normal limits bilaterally. The brainstem and cerebellum are normal. Vascular: Flow is present in the major intracranial arteries. Skull and upper cervical spine: The skullbase is within normal limits. The craniocervical junction is normal. Midline sagittal structures are unremarkable. Sinuses/Orbits:  Circumferential mucosal thickening is present in the left greater than right maxillary sinus. The remaining paranasal sinuses and the mastoid air cells are clear. The globes and orbits are within normal limits. IMPRESSION: 1. Normal MRI appearance of the brain. 2. Left greater than right maxillary sinus disease. Electronically Signed   By: San Morelle M.D.   On: 08/09/2016 16:34    Procedures Procedures (including critical care time)  Medications Ordered in ED Medications  LORazepam (ATIVAN) injection 1 mg (  not administered)     Initial Impression / Assessment and Plan / ED Course  I have reviewed the triage vital signs and the nursing notes.  Pertinent labs & imaging results that were available during my care of the patient were reviewed by me and considered in my medical decision making (see chart for details).    MRI results are still pending. MRI will determine if patient can be disposed home. Symptoms sound a lot like a complicated migraine. Patient still young, has not had any true copy of migraines in the past but certainly has had headaches ever migraine-like. If MRI is negative patient will be given follow-up with neurology. Patient's electrolytes without any sniffing abnormality. Nothing to explain the leg cramps. Patient hears feeling better. Patient without any notable focal deficits.   MRI negative other than some sinusitis.  Final Clinical Impressions(s) / ED Diagnoses   Final diagnoses:  Complicated migraine  Maxillary sinusitis, unspecified chronicity    New Prescriptions New Prescriptions   No medications on file     Fredia Sorrow, MD 08/09/16 [place]    Fredia Sorrow, MD 08/09/16 [phone]

## 2016-08-09 NOTE — ED Notes (Signed)
Gloria Shropshire, RN called MRI to determine delay in MRI, was informed they will come get her in 30 mins

## 2016-08-09 NOTE — ED Triage Notes (Signed)
Pt states "my eyes feel extremely dry, my legs have been cramping really bad and on Thursday I lost vision in my left eye for about five to ten minutes, it felt like something was covering my eye, and after it left I got a very bad headache. My legs still feel like they are cramping. My vision is totally back to normal and I don't have a headache right now." No neuro deficits noted, pt vision is clear, ambulatory with steady gait.

## 2016-08-13 ENCOUNTER — Encounter: Payer: Self-pay | Admitting: Neurology

## 2016-08-13 NOTE — ED Notes (Signed)
Pt. Called and had questions concerning Discharge instructions.  She was waiting on a Call from Neurology and this writer explained to her that her DC instructs her to call to make an appointment.  She verbalized understanding.

## 2016-10-30 ENCOUNTER — Encounter (HOSPITAL_BASED_OUTPATIENT_CLINIC_OR_DEPARTMENT_OTHER): Payer: Self-pay | Admitting: *Deleted

## 2016-10-30 NOTE — Progress Notes (Signed)
Npo after midnight arrive 1030 am [facility] Driver fiance stephen rice  Take es tylenol prn Needs urine pregnancy Needs hemaglobin

## 2016-11-05 ENCOUNTER — Ambulatory Visit (HOSPITAL_BASED_OUTPATIENT_CLINIC_OR_DEPARTMENT_OTHER)
Admission: RE | Admit: 2016-11-05 | Discharge: 2016-11-05 | Disposition: A | Discharge status: Home / Self Care | Payer: Medicaid Other | Source: Ambulatory Visit | Attending: Orthopedic Surgery | Admitting: Orthopedic Surgery

## 2016-11-05 ENCOUNTER — Encounter (HOSPITAL_BASED_OUTPATIENT_CLINIC_OR_DEPARTMENT_OTHER): Payer: Self-pay

## 2016-11-05 ENCOUNTER — Ambulatory Visit (HOSPITAL_BASED_OUTPATIENT_CLINIC_OR_DEPARTMENT_OTHER): Payer: Medicaid Other | Admitting: Anesthesiology

## 2016-11-05 ENCOUNTER — Encounter (HOSPITAL_BASED_OUTPATIENT_CLINIC_OR_DEPARTMENT_OTHER)
Admission: RE | Disposition: A | Discharge status: Home / Self Care | Payer: Self-pay | Source: Ambulatory Visit | Attending: Orthopedic Surgery

## 2016-11-05 DIAGNOSIS — Z91040 Latex allergy status: Secondary | ICD-10-CM | POA: Insufficient documentation

## 2016-11-05 DIAGNOSIS — Z886 Allergy status to analgesic agent status: Secondary | ICD-10-CM | POA: Diagnosis not present

## 2016-11-05 DIAGNOSIS — Z6841 Body Mass Index (BMI) 40.0 and over, adult: Secondary | ICD-10-CM | POA: Insufficient documentation

## 2016-11-05 DIAGNOSIS — M65861 Other synovitis and tenosynovitis, right lower leg: Secondary | ICD-10-CM | POA: Insufficient documentation

## 2016-11-05 DIAGNOSIS — S83241A Other tear of medial meniscus, current injury, right knee, initial encounter: Secondary | ICD-10-CM

## 2016-11-05 DIAGNOSIS — D573 Sickle-cell trait: Secondary | ICD-10-CM | POA: Insufficient documentation

## 2016-11-05 DIAGNOSIS — S83231A Complex tear of medial meniscus, current injury, right knee, initial encounter: Secondary | ICD-10-CM | POA: Diagnosis not present

## 2016-11-05 DIAGNOSIS — Z79899 Other long term (current) drug therapy: Secondary | ICD-10-CM | POA: Diagnosis not present

## 2016-11-05 DIAGNOSIS — W109XXA Fall (on) (from) unspecified stairs and steps, initial encounter: Secondary | ICD-10-CM | POA: Diagnosis not present

## 2016-11-05 HISTORY — DX: Headache, unspecified: R51.9

## 2016-11-05 HISTORY — PX: KNEE ARTHROSCOPY WITH MEDIAL MENISECTOMY: SHX5651

## 2016-11-05 HISTORY — DX: Other tear of medial meniscus, current injury, right knee, initial encounter: S83.241A

## 2016-11-05 HISTORY — DX: Claustrophobia: F40.240

## 2016-11-05 HISTORY — DX: Headache: R51

## 2016-11-05 LAB — POCT PREGNANCY, URINE: Preg Test, Ur: NEGATIVE

## 2016-11-05 LAB — POCT HEMOGLOBIN-HEMACUE: HEMOGLOBIN: 15.1 g/dL — AB (ref 12.0–15.0)

## 2016-11-05 SURGERY — ARTHROSCOPY, KNEE, WITH MEDIAL MENISCECTOMY
Anesthesia: General | Site: Knee | Laterality: Right

## 2016-11-05 MED ORDER — CEFAZOLIN SODIUM-DEXTROSE 2-4 GM/100ML-% IV SOLN
INTRAVENOUS | Status: AC
  Filled 2016-11-05: qty 100

## 2016-11-05 MED ORDER — MIDAZOLAM HCL 2 MG/2ML IJ SOLN
INTRAMUSCULAR | Status: AC
  Filled 2016-11-05: qty 2

## 2016-11-05 MED ORDER — SCOPOLAMINE 1 MG/3DAYS TD PT72
MEDICATED_PATCH | TRANSDERMAL | Status: AC
  Filled 2016-11-05: qty 1

## 2016-11-05 MED ORDER — ONDANSETRON 4 MG PO TBDP: 4.0000 mg | ORAL_TABLET | Freq: Three times a day (TID) | ORAL | 0 refills | Status: DC | PRN

## 2016-11-05 MED ORDER — SODIUM CHLORIDE 0.9 % IR SOLN
Status: DC | PRN
  Administered 2016-11-05 (×2): 3000 mL

## 2016-11-05 MED ORDER — FENTANYL CITRATE (PF) 100 MCG/2ML IJ SOLN
INTRAMUSCULAR | Status: AC
  Filled 2016-11-05: qty 2

## 2016-11-05 MED ORDER — KETOROLAC TROMETHAMINE 30 MG/ML IJ SOLN
INTRAMUSCULAR | Status: AC
  Filled 2016-11-05: qty 1

## 2016-11-05 MED ORDER — PROPOFOL 10 MG/ML IV BOLUS
INTRAVENOUS | Status: DC | PRN
  Administered 2016-11-05: 200 mg via INTRAVENOUS

## 2016-11-05 MED ORDER — BUPIVACAINE HCL 0.25 % IJ SOLN
INTRAMUSCULAR | Status: DC | PRN
  Administered 2016-11-05: 30 mL

## 2016-11-05 MED ORDER — DEXAMETHASONE SODIUM PHOSPHATE 10 MG/ML IJ SOLN
INTRAMUSCULAR | Status: AC
  Filled 2016-11-05: qty 1

## 2016-11-05 MED ORDER — HYDROCODONE-ACETAMINOPHEN 7.5-325 MG PO TABS: 1.0000 | ORAL_TABLET | Freq: Four times a day (QID) | ORAL | 0 refills | Status: DC | PRN

## 2016-11-05 MED ORDER — HYDROCODONE-ACETAMINOPHEN 7.5-325 MG PO TABS
1.0000 | ORAL_TABLET | Freq: Four times a day (QID) | ORAL | Status: DC | PRN
  Administered 2016-11-05: 1 via ORAL
  Filled 2016-11-05: qty 2

## 2016-11-05 MED ORDER — MIDAZOLAM HCL 5 MG/5ML IJ SOLN
INTRAMUSCULAR | Status: DC | PRN
  Administered 2016-11-05: 2 mg via INTRAVENOUS

## 2016-11-05 MED ORDER — ONDANSETRON HCL 4 MG/2ML IJ SOLN
INTRAMUSCULAR | Status: AC
  Filled 2016-11-05: qty 2

## 2016-11-05 MED ORDER — HYDROCODONE-ACETAMINOPHEN 7.5-325 MG PO TABS
ORAL_TABLET | ORAL | Status: AC
  Filled 2016-11-05: qty 1

## 2016-11-05 MED ORDER — CEFAZOLIN SODIUM-DEXTROSE 1-4 GM/50ML-% IV SOLN
INTRAVENOUS | Status: AC
  Filled 2016-11-05: qty 50

## 2016-11-05 MED ORDER — DIPHENHYDRAMINE HCL 50 MG/ML IJ SOLN
INTRAMUSCULAR | Status: AC
  Filled 2016-11-05: qty 1

## 2016-11-05 MED ORDER — LIDOCAINE 2% (20 MG/ML) 5 ML SYRINGE
INTRAMUSCULAR | Status: AC
  Filled 2016-11-05: qty 5

## 2016-11-05 MED ORDER — SCOPOLAMINE 1 MG/3DAYS TD PT72
1.0000 | MEDICATED_PATCH | TRANSDERMAL | Status: DC
  Administered 2016-11-05: 1.5 mg via TRANSDERMAL
  Filled 2016-11-05: qty 1

## 2016-11-05 MED ORDER — BUPIVACAINE HCL (PF) 0.25 % IJ SOLN
INTRAMUSCULAR | Status: AC
  Filled 2016-11-05: qty 30

## 2016-11-05 MED ORDER — PROPOFOL 10 MG/ML IV BOLUS
INTRAVENOUS | Status: AC
  Filled 2016-11-05: qty 20

## 2016-11-05 MED ORDER — LIDOCAINE HCL (CARDIAC) 20 MG/ML IV SOLN
INTRAVENOUS | Status: DC | PRN
  Administered 2016-11-05: 100 mg via INTRAVENOUS

## 2016-11-05 MED ORDER — DEXTROSE 5 % IV SOLN
3.0000 g | INTRAVENOUS | Status: AC
  Administered 2016-11-05: 3 g via INTRAVENOUS
  Filled 2016-11-05: qty 3000

## 2016-11-05 MED ORDER — FENTANYL CITRATE (PF) 100 MCG/2ML IJ SOLN
INTRAMUSCULAR | Status: DC | PRN
  Administered 2016-11-05 (×4): 50 ug via INTRAVENOUS

## 2016-11-05 MED ORDER — CHLORHEXIDINE GLUCONATE 4 % EX LIQD
60.0000 mL | Freq: Once | CUTANEOUS | Status: DC
  Filled 2016-11-05: qty 118

## 2016-11-05 MED ORDER — ONDANSETRON HCL 4 MG/2ML IJ SOLN
INTRAMUSCULAR | Status: DC | PRN
  Administered 2016-11-05: 4 mg via INTRAVENOUS

## 2016-11-05 MED ORDER — PROMETHAZINE HCL 25 MG/ML IJ SOLN
6.2500 mg | INTRAMUSCULAR | Status: DC | PRN
  Filled 2016-11-05: qty 1

## 2016-11-05 MED ORDER — LACTATED RINGERS IV SOLN
INTRAVENOUS | Status: DC
  Administered 2016-11-05: 12:00:00 via INTRAVENOUS
  Filled 2016-11-05: qty 1000

## 2016-11-05 MED ORDER — DEXAMETHASONE SODIUM PHOSPHATE 4 MG/ML IJ SOLN
INTRAMUSCULAR | Status: DC | PRN
  Administered 2016-11-05: 10 mg via INTRAVENOUS

## 2016-11-05 MED ORDER — HYDROMORPHONE HCL 1 MG/ML IJ SOLN
0.2500 mg | INTRAMUSCULAR | Status: DC | PRN
  Filled 2016-11-05: qty 0.5

## 2016-11-05 SURGICAL SUPPLY — 43 items
BANDAGE ELASTIC 6 VELCRO ST LF (GAUZE/BANDAGES/DRESSINGS) ×3 IMPLANT
BANDAGE ESMARK 6X9 LF (GAUZE/BANDAGES/DRESSINGS) ×1 IMPLANT
BLADE CUDA GRT WHITE 3.5 (BLADE) IMPLANT
BLADE CUTTER GATOR 3.5 (BLADE) IMPLANT
BLADE GREAT WHITE 4.2 (BLADE) IMPLANT
BLADE GREAT WHITE 4.2MM (BLADE)
BNDG ESMARK 6X9 LF (GAUZE/BANDAGES/DRESSINGS) ×3
CUFF TOURN SGL QUICK 44 (TOURNIQUET CUFF) ×3 IMPLANT
CUFF TOURNIQUET SINGLE 34IN LL (TOURNIQUET CUFF) IMPLANT
DRAPE ARTHROSCOPY W/POUCH 114 (DRAPES) ×3 IMPLANT
DRAPE U-SHAPE 47X51 STRL (DRAPES) ×3 IMPLANT
DRSG PAD ABDOMINAL 8X10 ST (GAUZE/BANDAGES/DRESSINGS) ×3 IMPLANT
DURAPREP 26ML APPLICATOR (WOUND CARE) ×3 IMPLANT
GAUZE SPONGE 4X4 12PLY STRL (GAUZE/BANDAGES/DRESSINGS) ×3 IMPLANT
GAUZE SPONGE 4X4 12PLY STRL LF (GAUZE/BANDAGES/DRESSINGS) ×3 IMPLANT
GAUZE XEROFORM 1X8 LF (GAUZE/BANDAGES/DRESSINGS) ×3 IMPLANT
GLOVE BIO SURGEON STRL SZ7.5 (GLOVE) ×3 IMPLANT
GLOVE BIOGEL PI IND STRL 7.5 (GLOVE) ×2 IMPLANT
GLOVE BIOGEL PI IND STRL 8 (GLOVE) ×1 IMPLANT
GLOVE BIOGEL PI INDICATOR 7.5 (GLOVE) ×4
GLOVE BIOGEL PI INDICATOR 8 (GLOVE) ×2
GOWN STRL REUS W/TWL LRG LVL3 (GOWN DISPOSABLE) ×6 IMPLANT
IV NS IRRIG 3000ML ARTHROMATIC (IV SOLUTION) ×6 IMPLANT
KIT RM TURNOVER CYSTO AR (KITS) ×3 IMPLANT
KNEE WRAP E Z 3 GEL PACK (MISCELLANEOUS) ×3 IMPLANT
MANIFOLD NEPTUNE II (INSTRUMENTS) ×3 IMPLANT
PACK ARTHROSCOPY DSU (CUSTOM PROCEDURE TRAY) ×3 IMPLANT
PACK BASIN DAY SURGERY FS (CUSTOM PROCEDURE TRAY) ×3 IMPLANT
PAD ABD 8X10 STRL (GAUZE/BANDAGES/DRESSINGS) ×3 IMPLANT
PAD ARMBOARD 7.5X6 YLW CONV (MISCELLANEOUS) IMPLANT
PADDING CAST COTTON 6X4 STRL (CAST SUPPLIES) ×3 IMPLANT
PROBE BIPOLAR 50 DEGREE SUCT (MISCELLANEOUS) IMPLANT
PROBE BIPOLAR ATHRO 135MM 90D (MISCELLANEOUS) ×3 IMPLANT
SET ARTHROSCOPY TUBING (MISCELLANEOUS) ×2
SET ARTHROSCOPY TUBING LN (MISCELLANEOUS) ×1 IMPLANT
SHAVER 4.2 MM LANZA 9391A (BLADE) ×3 IMPLANT
SUT ETHILON 3 0 PS 1 (SUTURE) ×3 IMPLANT
SYR CONTROL 10ML LL (SYRINGE) ×3 IMPLANT
TOWEL OR 17X24 6PK STRL BLUE (TOWEL DISPOSABLE) ×3 IMPLANT
TUBE CONNECTING 12'X1/4 (SUCTIONS) ×1
TUBE CONNECTING 12X1/4 (SUCTIONS) ×2 IMPLANT
WAND 30 DEG SABER W/CORD (SURGICAL WAND) IMPLANT
WATER STERILE IRR 500ML POUR (IV SOLUTION) ×3 IMPLANT

## 2016-11-05 NOTE — Discharge Instructions (Signed)
Orthopedic discharge instructions:  Okay for full weightbearing as tolerated to the right lower  Extremity, with crutch assistance. Use crutches for the next 1-2 wee - Maintain postoperative bandage for the next 3 days. You may remove the bandages at that time, and begin showering.  Cover wounds with Band-Aids. -for pain use Norco as directed. He may also use Advil and/or Aleve for pain in addition to applying  Ice to the knee 3-4 times a day for 20-30 minutes. - You will begin physical therapy approximately two weeks from surgery.  Call your surgeon if you experience:   1.  Fever over 101.0. 2.  Inability to urinate. 3.  Nausea and/or vomiting. 4.  Extreme swelling or bruising at the surgical site. 5.  Continued bleeding from the incision. 6.  Increased pain, redness or drainage from the incision. 7.  Problems related to your pain medication. 8.  Any problems and/or concerns 9.  Any change in color, movement or sensation   Post Anesthesia Home Care Instructions  Activity: Get plenty of rest for the remainder of the day. A responsible individual must stay with you for 24 hours following the procedure.  For the next 24 hours, DO NOT: -Drive a car -Paediatric nurse -Drink alcoholic beverages -Take any medication unless instructed by your physician -Make any legal decisions or sign important papers.  Meals: Start with liquid foods such as gelatin or soup. Progress to regular foods as tolerated. Avoid greasy, spicy, heavy foods. If nausea and/or vomiting occur, drink only clear liquids until the nausea and/or vomiting subsides. Call your physician if vomiting continues.  Special Instructions/Symptoms: Your throat may feel dry or sore from the anesthesia or the breathing tube placed in your throat during surgery. If this causes discomfort, gargle with warm salt water. The discomfort should disappear within 24 hours.  If you had a scopolamine patch placed behind your ear for the  management of post- operative nausea and/or vomiting:  1. The medication in the patch is effective for 72 hours, after which it should be removed.  Wrap patch in a tissue and discard in the trash. Wash hands thoroughly with soap and water. 2. You may remove the patch earlier than 72 hours if you experience unpleasant side effects which may include dry mouth, dizziness or visual disturbances. 3. Avoid touching the patch. Wash your hands with soap and water after contact with the patch.

## 2016-11-05 NOTE — Transfer of Care (Signed)
Last Vitals:  Vitals:   11/05/16 1042 11/05/16 1348  BP: 138/84 (!) 123/59  Pulse: 70 80  Resp: 18 (!) 21  Temp: 36.6 C   SpO2: 100% 98%    Last Pain:  Vitals:   11/05/16 1111  TempSrc:   PainSc: 7       Patients Stated Pain Goal: 5 (11/05/16 1111)  Immediate Anesthesia Transfer of Care Note  Patient: Gloria Yu  Procedure(s) Performed: Procedure(s) (LRB): RIGHT KNEE ARTHROSCOPY WITH PARTIAL MEDIAL MENISCUS (Right)  Patient Location: PACU  Anesthesia Type: General  Level of Consciousness: awake, alert  and oriented  Airway & Oxygen Therapy: Patient Spontanous Breathing and Patient connected to nasal cannula oxygen  Post-op Assessment: Report given to PACU RN and Post -op Vital signs reviewed and stable  Post vital signs: Reviewed and stable  Complications: No apparent anesthesia complications

## 2016-11-05 NOTE — H&P (Signed)
ORTHOPAEDIC H and P  REQUESTING PHYSICIAN: Nicholes Stairs, MD  PCP:  Patient, No Pcp Per  Chief Complaint: Right knee pain  HPI: Gloria Yu is a 31 y.o. female who complains of Right knee pain and swelling following a fall down the stairs 2 months ago.  She was noted in my office to have a right knee medial meniscus posterior root tear.  Given her young age and mechanical symptoms as well as medial joint line pain we recommended right knee arthroscopic medial meniscus repair versus partial meniscectomy.  She is here today for that surgery.  No new complaints today.  All questions were answered to her satisfaction in my office preoperatively.  Past Medical History:  Diagnosis Date  . Claustrophobia   . Headache    migraines  . Lump of breast, right    removed at age 81  . Sickle cell trait [facility])    Past Surgical History:  Procedure Laterality Date  . BREAST LUMPECTOMY Right age 28   benign   Social History   Social History  . Marital status: Single    Spouse name: [name]  . Number of children: [name]  . Years of education: [name]   Social History Main Topics  . Smoking status: Never Smoker  . Smokeless tobacco: Never Used  . Alcohol use No  . Drug use: No  . Sexual activity: Not Currently   Other Topics Concern  . None   Social History Narrative  . None   History reviewed. No pertinent family history. Allergies  Allergen Reactions  . Shellfish Allergy Anaphylaxis and Itching  . Aspirin Other (See Comments)    Nosebleeds  . Latex Hives   Prior to Admission medications   Medication Sig Start Date End Date Taking? Authorizing Provider  acetaminophen (TYLENOL) 500 MG tablet Take 1,000 mg by mouth every 6 (six) hours as needed.   Yes [provider]  diphenhydrAMINE (BENADRYL) 25 MG tablet Take 25 mg by mouth as needed.   Yes [provider]  cetirizine (ZYRTEC) 10 MG tablet Take 1 tablet (10 mg total) by mouth daily. 10/29/15   Frederica Kuster, PA-C  methocarbamol (ROBAXIN) 500 MG tablet Take 1 tablet (500 mg total) by mouth 2 (two) times daily. 03/06/16   Ashley Murrain, NP   No results found.  Positive ROS: All other systems have been reviewed and were otherwise negative with the exception of those mentioned in the HPI and as above.  Physical Exam: General: Alert, no acute distress Cardiovascular: No pedal edema Respiratory: No cyanosis, no use of accessory musculature GI: No organomegaly, abdomen is soft and non-tender Skin: No lesions in the area of chief complaint Neurologic: Sensation intact distally Psychiatric: Patient is competent for consent with normal mood and affect Lymphatic: No axillary or cervical lymphadenopathy  MUSCULOSKELETAL:  Examination right leg: Large body habitus.  Neutral alignment.  No effusion.  Tender on the medial joint line with a positive McMurray's.  Otherwise range of motion 5-120.  Stable to varus and valgus, negative Lachman, negative posterior drawer.  Calf soft and nontender.  Neurovascularly intact distally.  Assessment: Right knee posterior medial meniscus root tear.  Plan: - plan for arthroscopic right knee medial meniscus repair versus partial meniscectomy today. - we have discussed the risk benefits and indications of this procedure does include but are not limited to, bleeding, infection, damage to neurovascular structures, need for future surgery, development of arthritis,developing of blood clots, and the risk of  anesthesia. - she provided informed consent and we will proceed with surgery today.  She will discharge home from PACU postoperatively.    Nicholes Stairs, MD Cell [phone]    11/05/2016 11:34 AM

## 2016-11-05 NOTE — Brief Op Note (Signed)
11/05/2016  1:56 PM  PATIENT:  Gloria Yu  31 y.o. female  PRE-OPERATIVE DIAGNOSIS:  Right medial meniscus tear  POST-OPERATIVE DIAGNOSIS:  Right medial meniscus tear  PROCEDURE:  Procedure(s) with comments: RIGHT KNEE ARTHROSCOPY WITH PARTIAL MEDIAL MENISCUS (Right) - 90 mins  SURGEON:  Surgeon(s) and Role:    * Stann Mainland, Elly Modena, MD - Primary  PHYSICIAN ASSISTANT:   ASSISTANTS: none   ANESTHESIA:   general and local  EBL:  Total I/O In: 800 [I.V.:800] Out: 0   BLOOD ADMINISTERED:none  DRAINS: none   LOCAL MEDICATIONS USED:  MARCAINE     SPECIMEN:  No Specimen  DISPOSITION OF SPECIMEN:  [name]  COUNTS:  YES  TOURNIQUET:   Total Tourniquet Time Documented: Thigh (Right) - 20 minutes Total: Thigh (Right) - 20 minutes   DICTATION: .Note written in EPIC  PLAN OF CARE: Discharge to home after PACU  PATIENT DISPOSITION:  PACU - hemodynamically stable.   Delay start of Pharmacological VTE agent (>24hrs) due to surgical blood loss or risk of bleeding: not applicable

## 2016-11-05 NOTE — Anesthesia Procedure Notes (Signed)
Procedure Name: LMA Insertion Date/Time: 11/05/2016 12:47 PM Performed by: Duane Boston Pre-anesthesia Checklist: Patient identified, Emergency Drugs available, Suction available and Patient being monitored Patient Re-evaluated:Patient Re-evaluated prior to induction Oxygen Delivery Method: Circle system utilized Preoxygenation: Pre-oxygenation with 100% oxygen Induction Type: IV induction Ventilation: Mask ventilation without difficulty LMA: LMA inserted LMA Size: 4.0 Number of attempts: 1 Airway Equipment and Method: Bite block Placement Confirmation: positive ETCO2 Tube secured with: Tape Dental Injury: Teeth and Oropharynx as per pre-operative assessment

## 2016-11-05 NOTE — Anesthesia Preprocedure Evaluation (Signed)
Anesthesia Evaluation  Patient identified by MRN, date of birth, ID band Patient awake    Reviewed: Allergy & Precautions, NPO status , Patient's Chart, lab work & pertinent test results  Airway Mallampati: II  TM Distance: >3 FB Neck ROM: Full    Dental no notable dental hx. (+) Dental Advisory Given   Pulmonary neg pulmonary ROS,    Pulmonary exam normal        Cardiovascular negative cardio ROS Normal cardiovascular exam     Neuro/Psych PSYCHIATRIC DISORDERS Anxiety negative neurological ROS     GI/Hepatic negative GI ROS, Neg liver ROS,   Endo/Other  Morbid obesity  Renal/GU negative Renal ROS  negative genitourinary   Musculoskeletal negative musculoskeletal ROS (+)   Abdominal   Peds negative pediatric ROS (+)  Hematology negative hematology ROS (+)   Anesthesia Other Findings   Reproductive/Obstetrics negative OB ROS                             Anesthesia Physical Anesthesia Plan  ASA: III  Anesthesia Plan: General   Post-op Pain Management:    Induction: Intravenous  PONV Risk Score and Plan: 3 and Ondansetron, Dexamethasone and Scopolamine patch - Pre-op  Airway Management Planned: LMA  Additional Equipment:   Intra-op Plan:   Post-operative Plan: Extubation in OR  Informed Consent: I have reviewed the patients History and Physical, chart, labs and discussed the procedure including the risks, benefits and alternatives for the proposed anesthesia with the patient or authorized representative who has indicated his/her understanding and acceptance.   Dental advisory given  Plan Discussed with: CRNA, Anesthesiologist and Surgeon  Anesthesia Plan Comments:         Anesthesia Quick Evaluation

## 2016-11-06 ENCOUNTER — Encounter (HOSPITAL_BASED_OUTPATIENT_CLINIC_OR_DEPARTMENT_OTHER): Payer: Self-pay | Admitting: Orthopedic Surgery

## 2016-11-06 NOTE — Anesthesia Postprocedure Evaluation (Signed)
Anesthesia Post Note  Patient: Gloria Yu  Procedure(s) Performed: RIGHT KNEE ARTHROSCOPY WITH PARTIAL MEDIAL MENISCUS (Right Knee)     Patient location during evaluation: PACU Anesthesia Type: General Level of consciousness: sedated Pain management: pain level controlled Vital Signs Assessment: post-procedure vital signs reviewed and stable Respiratory status: spontaneous breathing and respiratory function stable Cardiovascular status: stable Postop Assessment: no apparent nausea or vomiting Anesthetic complications: no    Last Vitals:  Vitals:   11/05/16 1440 11/05/16 1600  BP: (!) 105/54 114/75  Pulse: 75 73  Resp: 18 16  Temp:  37.1 C  SpO2: 97% 99%    Last Pain:  Vitals:   11/06/16 1008  TempSrc:   PainSc: 4                  Enedelia Martorelli DANIEL

## 2016-11-07 NOTE — Op Note (Addendum)
Surgery Date: 11/05/2016  Surgeon(s): Nicholes Stairs, MD  ANESTHESIA:  general  FLUIDS: Per anesthesia record.   ESTIMATED BLOOD LOSS: minimal  PREOPERATIVE DIAGNOSES:  1. Right knee medial meniscus tear 2.  knee synovitis  POSTOPERATIVE DIAGNOSES:  same  PROCEDURES PERFORMED:  1. Right knee arthroscopy with limited synovectomy 2. knee arthroscopy with arthroscopic partial medial meniscectomy 3. knee arthroscopy with arthroscopic chondroplasty medial femoral condyle and medial tibial plateau.   Tourniquet time: 20 minutes  DESCRIPTION OF PROCEDURE: Gloria Yu is a 31 y.o.-year-old female with Right  knee medial meniscus radial tear of posterior root tear. Plans are to proceed with partial medial meniscectomy and diagnostic arthroscopy with debridement as indicated. Full discussion held regarding risks benefits alternatives and complications related surgical intervention. Conservative care options reviewed. All questions answered.  The patient was identified in the preoperative holding area and the operative extremity was marked. The patient was brought to the operating room and transferred to operating table in a supine position. Satisfactory general anesthesia was induced by anesthesiology.    Standard anterolateral, anteromedial arthroscopy portals were obtained. The anteromedial portal was obtained with a spinal needle for localization under direct visualization with subsequent diagnostic findings.   Anteromedial and anterolateral chambers: moderate synovitis. The synovitis was debrided with a 4.5 mm full radius shaver through both the anteromedial and lateral portals.   Suprapatellar pouch and gutters: mild synovitis or debris. Patella chondral surface: Grade 0 Trochlear chondral surface: Grade 0 Patellofemoral tracking: midline Medial meniscus: radial tear of the posterior root, with some attachment intact to the plateau.  Medial femoral condyle flexion bearing  surface: Grade 0 Medial femoral condyle extension bearing surface: Grade 1 Medial tibial plateau: Grade 1 Anterior cruciate ligament:some fraying noted, but intact Posterior cruciate ligament:stable Lateral meniscus: intact.   Lateral femoral condyle flexion bearing surface: Grade 0 Lateral femoral condyle extension bearing surface: Grade 0 Lateral tibial plateau: Grade 0    After completion of synovectomy, diagnostic exam, and debridements as described, all compartments were checked and no residual debris remained. Medial partial meniscectomy was carried out with biters as well as full radius shaver.  Hemostasis was achieved with the cautery wand. The portals were approximated with buried monocryl. All excess fluid was expressed from the joint.  Xeroform sterile gauze dressings were applied followed by Ace bandage and ice pack.  All counts were correct 2 and there were no immediate complications.  DISPOSITION: The patient was awakened from general anesthetic, extubated, taken to the recovery room in medically stable condition, no apparent complications. The patient may be weightbearing as tolerated to the operative lower extremity.  Range of motion of right knee as tolerated. She will follow up with me in 2 weeks for a wound check.

## 2016-11-18 ENCOUNTER — Ambulatory Visit: Payer: Medicaid Other | Admitting: Neurology

## 2017-01-03 ENCOUNTER — Emergency Department (HOSPITAL_COMMUNITY)
Admission: EM | Admit: 2017-01-03 | Discharge: 2017-01-03 | Disposition: A | Discharge status: Home / Self Care | Payer: Medicaid Other | Attending: Emergency Medicine | Admitting: Emergency Medicine

## 2017-01-03 ENCOUNTER — Encounter (HOSPITAL_COMMUNITY): Payer: Self-pay | Admitting: Emergency Medicine

## 2017-01-03 ENCOUNTER — Emergency Department (HOSPITAL_COMMUNITY): Payer: Medicaid Other

## 2017-01-03 DIAGNOSIS — Z3A01 Less than 8 weeks gestation of pregnancy: Secondary | ICD-10-CM | POA: Diagnosis not present

## 2017-01-03 DIAGNOSIS — O209 Hemorrhage in early pregnancy, unspecified: Secondary | ICD-10-CM | POA: Insufficient documentation

## 2017-01-03 DIAGNOSIS — D571 Sickle-cell disease without crisis: Secondary | ICD-10-CM | POA: Diagnosis not present

## 2017-01-03 DIAGNOSIS — O9989 Other specified diseases and conditions complicating pregnancy, childbirth and the puerperium: Secondary | ICD-10-CM | POA: Diagnosis not present

## 2017-01-03 DIAGNOSIS — O4690 Antepartum hemorrhage, unspecified, unspecified trimester: Secondary | ICD-10-CM

## 2017-01-03 LAB — BASIC METABOLIC PANEL
ANION GAP: 5 (ref 5–15)
BUN: <5 mg/dL — ABNORMAL LOW (ref 6–20)
CHLORIDE: 104 mmol/L (ref 101–111)
CO2: 23 mmol/L (ref 22–32)
CREATININE: 0.59 mg/dL (ref 0.44–1.00)
Calcium: 8.7 mg/dL — ABNORMAL LOW (ref 8.9–10.3)
GFR calc non Af Amer: >60 mL/min (ref >60)
Glucose, Bld: 88 mg/dL (ref 65–99)
Potassium: 4.3 mmol/L (ref 3.5–5.1)
SODIUM: 132 mmol/L — AB (ref 135–145)

## 2017-01-03 LAB — CBC
HEMATOCRIT: 36.1 % (ref 36.0–46.0)
HEMOGLOBIN: 12.8 g/dL (ref 12.0–15.0)
MCH: 27.5 pg (ref 26.0–34.0)
MCHC: 35.5 g/dL (ref 30.0–36.0)
MCV: 77.6 fL — ABNORMAL LOW (ref 78.0–100.0)
Platelets: 299 10*3/uL (ref 150–400)
RBC: 4.65 MIL/uL (ref 3.87–5.11)
RDW: 14.5 % (ref 11.5–15.5)
WBC: 5.7 10*3/uL (ref 4.0–10.5)

## 2017-01-03 LAB — I-STAT BETA HCG BLOOD, ED (MC, WL, AP ONLY)

## 2017-01-03 LAB — HCG, QUANTITATIVE, PREGNANCY: hCG, Beta Chain, Quant, S: 82980 m[IU]/mL — ABNORMAL HIGH (ref <5)

## 2017-01-03 MED ORDER — ACETAMINOPHEN 500 MG PO TABS
1000.0000 mg | ORAL_TABLET | Freq: Once | ORAL | Status: AC
  Administered 2017-01-03: 1000 mg via ORAL
  Filled 2017-01-03: qty 2

## 2017-01-03 MED ORDER — DOXYLAMINE-PYRIDOXINE 10-10 MG PO TBEC: DELAYED_RELEASE_TABLET | ORAL | 0 refills | Status: DC

## 2017-01-03 NOTE — ED Notes (Signed)
Patient transported to Ultrasound 

## 2017-01-03 NOTE — ED Triage Notes (Addendum)
Pt states hx of Miscarriage in April/May. Pt is [redacted] weeks pregnant and has had dark red blood to toilet paper X 2 today. Pt states pain to lower abdomen 6/10.

## 2017-01-03 NOTE — ED Provider Notes (Signed)
[place] EMERGENCY DEPARTMENT Provider Note   CSN: [id number] Arrival date & time: 01/03/17  1052     History   Chief Complaint Chief Complaint  Patient presents with  . Vaginal Bleeding    while prenant  . Threatened Miscarriage  . Abdominal Pain    HPI Gloria Yu is a 31 y.o. female.  HPI Patient presents to the emergency department with lower abdominal discomfort and some small vaginal bleeding.  The patient states that she is pregnant about 7 weeks she states she saw her GYN doctor 2 weeks ago.  Patient states she has had no other complications.  Patient states that she had nausea but no vomiting.  The patient denies chest pain, shortness of breath, headache,blurred vision, neck pain, fever, cough, weakness, numbness, dizziness, anorexia, edema,  vomiting, diarrhea, rash, back pain, dysuria, hematemesis, bloody stool, near syncope, or syncope. Past Medical History:  Diagnosis Date  . Claustrophobia   . Headache    migraines  . Lump of breast, right    removed at age 46  . Sickle cell trait [facility])     Patient Active Problem List   Diagnosis Date Noted  . Acute medial meniscus tear of right knee 11/05/2016    Past Surgical History:  Procedure Laterality Date  . BREAST LUMPECTOMY Right age 8   benign  . KNEE ARTHROSCOPY WITH MEDIAL MENISECTOMY Right 11/05/2016   Procedure: RIGHT KNEE ARTHROSCOPY WITH PARTIAL MEDIAL MENISCUS;  Surgeon: Nicholes Stairs, MD;  Location: [facility];  Service: Orthopedics;  Laterality: Right;  90 mins    OB History    Gravida Para Term Preterm AB Living   2 1 1   1 1    SAB TAB Ectopic Multiple Live Births   1       1       Home Medications    Prior to Admission medications   Medication Sig Start Date End Date Taking? Authorizing Provider  acetaminophen (TYLENOL) 500 MG tablet Take 1,000 mg by mouth every 6 (six) hours as needed.    [provider]  cetirizine (ZYRTEC) 10 MG  tablet Take 1 tablet (10 mg total) by mouth daily. 10/29/15   Frederica Kuster, PA-C  diphenhydrAMINE (BENADRYL) 25 MG tablet Take 25 mg by mouth as needed.    [provider]  HYDROcodone-acetaminophen (NORCO) 7.5-325 MG tablet Take 1-2 tablets by mouth every 6 (six) hours as needed for moderate pain. 11/05/16   Nicholes Stairs, MD  methocarbamol (ROBAXIN) 500 MG tablet Take 1 tablet (500 mg total) by mouth 2 (two) times daily. 03/06/16   Ashley Murrain, NP  ondansetron (ZOFRAN ODT) 4 MG disintegrating tablet Take 1 tablet (4 mg total) by mouth every 8 (eight) hours as needed for nausea or vomiting. 11/05/16   Nicholes Stairs, MD    Family History No family history on file.  Social History Social History   Tobacco Use  . Smoking status: Never Smoker  . Smokeless tobacco: Never Used  Substance Use Topics  . Alcohol use: No  . Drug use: No     Allergies   Shellfish allergy; Aspirin; and Latex   Review of Systems Review of Systems  All other systems negative except as documented in the HPI. All pertinent positives and negatives as reviewed in the HPI. Physical Exam Updated Vital Signs BP (!) 114/56   Pulse 72   Temp 99.4 F (37.4 C)   Resp 18  Ht 5\' 3"  (1.6 m)   LMP 11/11/2016   SpO2 100%   BMI 45.97 kg/m   Physical Exam  Constitutional: She is oriented to person, place, and time. She appears well-developed and well-nourished. No distress.  HENT:  Head: Normocephalic and atraumatic.  Mouth/Throat: Oropharynx is clear and moist.  Eyes: Pupils are equal, round, and reactive to light.  Neck: Normal range of motion. Neck supple.  Cardiovascular: Normal rate, regular rhythm and normal heart sounds. Exam reveals no gallop and no friction rub.  No murmur heard. Pulmonary/Chest: Effort normal and breath sounds normal. No respiratory distress. She has no wheezes.  Abdominal: Soft. Bowel sounds are normal. She exhibits no distension. There is no tenderness.    Genitourinary: Cervix exhibits no motion tenderness. Right adnexum displays no mass, no tenderness and no fullness. Left adnexum displays no mass, no tenderness and no fullness. No erythema, tenderness or bleeding in the vagina. No foreign body in the vagina. No signs of injury around the vagina. No vaginal discharge found.  Neurological: She is alert and oriented to person, place, and time. She exhibits normal muscle tone. Coordination normal.  Skin: Skin is warm and dry. Capillary refill takes less than 2 seconds. No rash noted. No erythema.  Psychiatric: She has a normal mood and affect. Her behavior is normal.  Nursing note and vitals reviewed.    ED Treatments / Results  Labs (all labs ordered are listed, but only abnormal results are displayed) Labs Reviewed  CBC - Abnormal; Notable for the following components:      Result Value   MCV 77.6 (*)    All other components within normal limits  BASIC METABOLIC PANEL - Abnormal; Notable for the following components:   Sodium 132 (*)    BUN <5 (*)    Calcium 8.7 (*)    All other components within normal limits  HCG, QUANTITATIVE, PREGNANCY - Abnormal; Notable for the following components:   hCG, Beta Chain, Quant, S 82,980 (*)    All other components within normal limits  I-STAT BETA HCG BLOOD, ED ([facility], [facility], [facility] ONLY) - Abnormal; Notable for the following components:   I-stat hCG, quantitative >2,000.0 (*)    All other components within normal limits    EKG  EKG Interpretation None       Radiology US Ob Comp Less 14 Wks  Result Date: 01/03/2017 CLINICAL DATA:  Vaginal bleeding since 0700 hrs, pregnant, quantitative beta HCG = 82,980 EXAM: OBSTETRIC <14 WK [place] AND TRANSVAGINAL OB US TECHNIQUE: Both transabdominal and transvaginal ultrasound examinations were performed for complete evaluation of the gestation as well as the maternal uterus, adnexal regions, and pelvic cul-de-sac. Transvaginal technique was performed to assess early  pregnancy. COMPARISON:  None for this gestation. FINDINGS: Intrauterine gestational sac: Present, single Yolk sac:  Present Embryo:  Present Cardiac Activity: Present Heart Rate: 153  bpm CRL:  11.0  mm   7 w   2 d                  [place] EDC: 08/20/2017 Subchorionic hemorrhage:  None visualized. Maternal uterus/adnexae: Large exophytic heterogeneous mass likely leiomyoma at anterior RIGHT uterus 4.3 x 3.5 x 4.3 cm. Remainder of uterus unremarkable. RIGHT ovary normal size and morphology, 3.1 x 2.1 x 1.7 cm. LEFT ovary normal size and morphology, 2.7 x 1.7 x 2.0 cm. Trace free pelvic fluid. No adnexal masses. IMPRESSION: Single live intrauterine gestation at 7 weeks 2 days EGA by crown-rump length. Large  probable exophytic leiomyoma at upper uterine segment 4.3 cm greatest size. No acute abnormalities. Electronically Signed   By: Lavonia Dana M.D.   On: 01/03/2017 15:25   US Ob Transvaginal  Result Date: 01/03/2017 CLINICAL DATA:  Vaginal bleeding since 0700 hrs, pregnant, quantitative beta HCG = 82,980 EXAM: OBSTETRIC <14 WK [place] AND TRANSVAGINAL OB US TECHNIQUE: Both transabdominal and transvaginal ultrasound examinations were performed for complete evaluation of the gestation as well as the maternal uterus, adnexal regions, and pelvic cul-de-sac. Transvaginal technique was performed to assess early pregnancy. COMPARISON:  None for this gestation. FINDINGS: Intrauterine gestational sac: Present, single Yolk sac:  Present Embryo:  Present Cardiac Activity: Present Heart Rate: 153  bpm CRL:  11.0  mm   7 w   2 d                  [place] EDC: 08/20/2017 Subchorionic hemorrhage:  None visualized. Maternal uterus/adnexae: Large exophytic heterogeneous mass likely leiomyoma at anterior RIGHT uterus 4.3 x 3.5 x 4.3 cm. Remainder of uterus unremarkable. RIGHT ovary normal size and morphology, 3.1 x 2.1 x 1.7 cm. LEFT ovary normal size and morphology, 2.7 x 1.7 x 2.0 cm. Trace free pelvic fluid. No adnexal masses. IMPRESSION:  Single live intrauterine gestation at 7 weeks 2 days EGA by crown-rump length. Large probable exophytic leiomyoma at upper uterine segment 4.3 cm greatest size. No acute abnormalities. Electronically Signed   By: Lavonia Dana M.D.   On: 01/03/2017 15:25    Procedures Procedures (including critical care time)  Medications Ordered in ED Medications - No data to display   Initial Impression / Assessment and Plan / ED Course  I have reviewed the triage vital signs and the nursing notes.  Pertinent labs & imaging results that were available during my care of the patient were reviewed by me and considered in my medical decision making (see chart for details).     The patient's ultrasound does not show any acute abnormality.  I feel that the patient's GYN doctor needs to see her since possible she does have an appointment next Thursday.  The patient is advised to go to [facility] for any worsening in her condition.  Final Clinical Impressions(s) / ED Diagnoses   Final diagnoses:  None    ED Discharge Orders    None       Dalia Heading, PA-C 01/03/17 1542    Tanna Furry, MD 01/08/17 0010

## 2017-01-03 NOTE — Discharge Instructions (Signed)
Follow-up with your GYN doctor as soon as possible. return here as needed.  Tylenol 1000 mg every 4 hours for pain.

## 2017-03-02 ENCOUNTER — Ambulatory Visit: Payer: Medicaid Other | Admitting: Neurology

## 2017-03-24 ENCOUNTER — Emergency Department (HOSPITAL_COMMUNITY): Payer: Medicaid Other

## 2017-03-24 ENCOUNTER — Other Ambulatory Visit: Payer: Self-pay

## 2017-03-24 ENCOUNTER — Emergency Department (HOSPITAL_COMMUNITY)
Admission: EM | Admit: 2017-03-24 | Discharge: 2017-03-25 | Disposition: A | Discharge status: Home / Self Care | Payer: Medicaid Other | Attending: Emergency Medicine | Admitting: Emergency Medicine

## 2017-03-24 ENCOUNTER — Encounter (HOSPITAL_COMMUNITY): Payer: Self-pay | Admitting: Emergency Medicine

## 2017-03-24 DIAGNOSIS — R102 Pelvic and perineal pain: Secondary | ICD-10-CM | POA: Insufficient documentation

## 2017-03-24 DIAGNOSIS — Z91040 Latex allergy status: Secondary | ICD-10-CM | POA: Insufficient documentation

## 2017-03-24 DIAGNOSIS — Z3A19 19 weeks gestation of pregnancy: Secondary | ICD-10-CM | POA: Diagnosis not present

## 2017-03-24 DIAGNOSIS — N949 Unspecified condition associated with female genital organs and menstrual cycle: Secondary | ICD-10-CM

## 2017-03-24 DIAGNOSIS — Z79899 Other long term (current) drug therapy: Secondary | ICD-10-CM | POA: Diagnosis not present

## 2017-03-24 DIAGNOSIS — O9989 Other specified diseases and conditions complicating pregnancy, childbirth and the puerperium: Secondary | ICD-10-CM | POA: Insufficient documentation

## 2017-03-24 DIAGNOSIS — R58 Hemorrhage, not elsewhere classified: Secondary | ICD-10-CM

## 2017-03-24 LAB — CBC WITH DIFFERENTIAL/PLATELET
Basophils Absolute: 0 10*3/uL (ref 0.0–0.1)
Basophils Relative: 0 %
Eosinophils Absolute: 0.1 10*3/uL (ref 0.0–0.7)
Eosinophils Relative: 2 %
HCT: 36.2 % (ref 36.0–46.0)
Hemoglobin: 12.9 g/dL (ref 12.0–15.0)
Lymphocytes Relative: 28 %
Lymphs Abs: 1.9 10*3/uL (ref 0.7–4.0)
MCH: 28.3 pg (ref 26.0–34.0)
MCHC: 35.6 g/dL (ref 30.0–36.0)
MCV: 79.4 fL (ref 78.0–100.0)
Monocytes Absolute: 0.6 10*3/uL (ref 0.1–1.0)
Monocytes Relative: 8 %
Neutro Abs: 4.3 10*3/uL (ref 1.7–7.7)
Neutrophils Relative %: 62 %
Platelets: 274 10*3/uL (ref 150–400)
RBC: 4.56 MIL/uL (ref 3.87–5.11)
RDW: 14 % (ref 11.5–15.5)
WBC: 6.9 10*3/uL (ref 4.0–10.5)

## 2017-03-24 LAB — URINALYSIS, ROUTINE W REFLEX MICROSCOPIC
Bilirubin Urine: NEGATIVE
Glucose, UA: NEGATIVE mg/dL
Hgb urine dipstick: NEGATIVE
Ketones, ur: NEGATIVE mg/dL
Leukocytes, UA: NEGATIVE
Nitrite: NEGATIVE
Protein, ur: NEGATIVE mg/dL
Specific Gravity, Urine: 1.01 (ref 1.005–1.030)
pH: 7 (ref 5.0–8.0)

## 2017-03-24 LAB — BASIC METABOLIC PANEL
Anion gap: 8 (ref 5–15)
BUN: 5 mg/dL — ABNORMAL LOW (ref 6–20)
CO2: 22 mmol/L (ref 22–32)
Calcium: 8.7 mg/dL — ABNORMAL LOW (ref 8.9–10.3)
Chloride: 105 mmol/L (ref 101–111)
Creatinine, Ser: 0.54 mg/dL (ref 0.44–1.00)
GFR calc Af Amer: >60 mL/min (ref >60)
GFR calc non Af Amer: >60 mL/min (ref >60)
Glucose, Bld: 75 mg/dL (ref 65–99)
Potassium: 3.5 mmol/L (ref 3.5–5.1)
Sodium: 135 mmol/L (ref 135–145)

## 2017-03-24 LAB — HCG, QUANTITATIVE, PREGNANCY: hCG, Beta Chain, Quant, S: 13170 m[IU]/mL — ABNORMAL HIGH (ref <5)

## 2017-03-24 NOTE — ED Provider Notes (Signed)
Patient placed in Quick Look pathway, seen and evaluated   Chief Complaint: Abdominal pain, vaginal bleeding  HPI:   Patient who is G4P1 [redacted] weeks pregnant currently presents for evaluation of acute onset, progressively worsening abdominal pain and vaginal bleeding.  Abdominal pain began yesterday evening, described as a cramping sensation generally with dull aching pain in the lower abdomen primarily in the right lower quadrant and suprapubic region.  She also developed vaginal bleeding this morning.  She has had spotting throughout her pregnancy but has not had any for approximately 2 weeks.  She called her OB/GYN this morning who said her symptoms sounded similar to round ligament pain and recommended taking Tylenol every 6 hours which has not been helpful.  Denies nausea, vomiting.  She does note urinary frequency but states she has had a lot of water to drink today.  Denies hematuria or dysuria.  ROS: Positive for abdominal pain, vaginal bleeding, urinary frequency.  Negative for nausea, vomiting, fevers, chills, hematuria, dysuria.   Physical Exam:   Gen: No distress  Neuro: Awake and Alert  Skin: Warm    Focused Exam: Uterine fundus just inferior to the umbilicus.  Tenderness to palpation in the right flank and right lower quadrant region as well as suprapubic region.  Worsening pain around the pelvic brim.  Murphy sign absent, Rovsing's absent, no CVA tenderness.   Initiation of care has begun. The patient has been counseled on the process, plan, and necessity for staying for the completion/evaluation, and the remainder of the medical screening examination    Debroah Baller 03/24/17 2052    Orpah Greek, MD 03/25/17 [phone]

## 2017-03-24 NOTE — ED Triage Notes (Signed)
Pt reports lower abd cramping onset last night. Pt is [redacted]wks pregnant, EDD 08/17/17. Pt reports she was spotting earlier today but is not concerned b/c she has spotted her entire pregnancy. Pt has been taking Tylenol q6-8 hrs.

## 2017-03-25 MED ORDER — ACETAMINOPHEN 500 MG PO TABS
1000.0000 mg | ORAL_TABLET | Freq: Once | ORAL | Status: AC
  Administered 2017-03-25: 1000 mg via ORAL
  Filled 2017-03-25: qty 2

## 2017-03-25 NOTE — ED Notes (Signed)
ED Provider at bedside. 

## 2017-03-25 NOTE — ED Provider Notes (Signed)
[place] EMERGENCY DEPARTMENT Provider Note   CSN: [id number] Arrival date & time: 03/24/17  2019     History   Chief Complaint Chief Complaint  Patient presents with  . Abdominal Pain    HPI Gloria Yu is a 32 y.o. female.  G4 P1 female at 19 weeks and 2 days presents with complaints of right-sided and lower abdominal and pelvic pain and cramping.  She reports that pain began yesterday.  She has also had some spotting, but this has been present throughout her pregnancy and has not changed.  No nausea, vomiting or diarrhea.  Has not had a fever.  She denies upper abdominal pain.  She has not noticed any urinary symptoms.      Past Medical History:  Diagnosis Date  . Claustrophobia   . Headache    migraines  . Lump of breast, right    removed at age 61  . Sickle cell trait [facility])     Patient Active Problem List   Diagnosis Date Noted  . Acute medial meniscus tear of right knee 11/05/2016    Past Surgical History:  Procedure Laterality Date  . BREAST LUMPECTOMY Right age 45   benign  . KNEE ARTHROSCOPY WITH MEDIAL MENISECTOMY Right 11/05/2016   Procedure: RIGHT KNEE ARTHROSCOPY WITH PARTIAL MEDIAL MENISCUS;  Surgeon: Nicholes Stairs, MD;  Location: [facility];  Service: Orthopedics;  Laterality: Right;  90 mins    OB History    Gravida Para Term Preterm AB Living   3 1 1   1 1    SAB TAB Ectopic Multiple Live Births   1       1       Home Medications    Prior to Admission medications   Medication Sig Start Date End Date Taking? Authorizing Provider  acetaminophen (TYLENOL) 500 MG tablet Take 1,000 mg by mouth every 6 (six) hours as needed.    [provider]  cetirizine (ZYRTEC) 10 MG tablet Take 1 tablet (10 mg total) by mouth daily. 10/29/15   Frederica Kuster, PA-C  diphenhydrAMINE (BENADRYL) 25 MG tablet Take 25 mg by mouth as needed.    [provider]  Doxylamine-Pyridoxine 10-10 MG TBEC  Day 1- Take 2 tablets, by mouth at bedtime.Day 2- Take 2 tablets at bedtime. If your nausea and vomiting is better or controlled on Day2, continue to take 2 tablets every night at bedtime. Day 3- If you still had nausea and vomiting on Day 2, take 3 tablets on Day 3 1 tablet in the morning and 2 tablets at bedtime. nausea and vomiting on Day 3, start taking 4 tablets each day 1 tablet in the morning,1 tablet in the afternoon, and 2 tablets at bed 01/03/17   Lawyer, Harrell Gave, PA-C  HYDROcodone-acetaminophen (NORCO) 7.5-325 MG tablet Take 1-2 tablets by mouth every 6 (six) hours as needed for moderate pain. 11/05/16   Nicholes Stairs, MD  methocarbamol (ROBAXIN) 500 MG tablet Take 1 tablet (500 mg total) by mouth 2 (two) times daily. 03/06/16   Ashley Murrain, NP  ondansetron (ZOFRAN ODT) 4 MG disintegrating tablet Take 1 tablet (4 mg total) by mouth every 8 (eight) hours as needed for nausea or vomiting. 11/05/16   Nicholes Stairs, MD    Family History No family history on file.  Social History Social History   Tobacco Use  . Smoking status: Never Smoker  . Smokeless tobacco: Never Used  Substance Use Topics  .  Alcohol use: No  . Drug use: No     Allergies   Shellfish allergy; Aspirin; and Latex   Review of Systems Review of Systems  Genitourinary: Positive for pelvic pain.  All other systems reviewed and are negative.    Physical Exam Updated Vital Signs BP 120/69 (BP Location: Left Arm)   Pulse 74   Temp 98 F (36.7 C) (Oral)   Resp 18   Ht 5\' 3"  (1.6 m)   Wt 117.9 kg (260 lb)   LMP 11/10/2016 (Approximate)   SpO2 100%   BMI 46.06 kg/m   Physical Exam  Constitutional: She is oriented to person, place, and time. She appears well-developed and well-nourished. No distress.  HENT:  Head: Normocephalic and atraumatic.  Right Ear: Hearing normal.  Left Ear: Hearing normal.  Nose: Nose normal.  Mouth/Throat: Oropharynx is clear and moist and mucous membranes  are normal.  Eyes: Conjunctivae and EOM are normal. Pupils are equal, round, and reactive to light.  Neck: Normal range of motion. Neck supple.  Cardiovascular: Regular rhythm, S1 normal and S2 normal. Exam reveals no gallop and no friction rub.  No murmur heard. Pulmonary/Chest: Effort normal and breath sounds normal. No respiratory distress. She exhibits no tenderness.  Abdominal: Soft. Normal appearance and bowel sounds are normal. There is no hepatosplenomegaly. There is no tenderness. There is no rebound, no guarding, no tenderness at McBurney's point and negative Murphy's sign. No hernia.  Mild right-sided tenderness without guarding or rebound  Genitourinary: Cervix exhibits no motion tenderness, no discharge and no friability.  Musculoskeletal: Normal range of motion.  Neurological: She is alert and oriented to person, place, and time. She has normal strength. No cranial nerve deficit or sensory deficit. Coordination normal. GCS eye subscore is 4. GCS verbal subscore is 5. GCS motor subscore is 6.  Skin: Skin is warm, dry and intact. No rash noted. No cyanosis.  Psychiatric: She has a normal mood and affect. Her speech is normal and behavior is normal. Thought content normal.  Nursing note and vitals reviewed.    ED Treatments / Results  Labs (all labs ordered are listed, but only abnormal results are displayed) Labs Reviewed  HCG, QUANTITATIVE, PREGNANCY - Abnormal; Notable for the following components:      Result Value   hCG, Beta Chain, Quant, S 13,170 (*)    All other components within normal limits  BASIC METABOLIC PANEL - Abnormal; Notable for the following components:   BUN 5 (*)    Calcium 8.7 (*)    All other components within normal limits  CBC WITH DIFFERENTIAL/PLATELET  URINALYSIS, ROUTINE W REFLEX MICROSCOPIC    EKG  EKG Interpretation None       Radiology US Ob Limited  Result Date: 03/24/2017 CLINICAL DATA:  Cramping, spotting EXAM: LIMITED OBSTETRIC  ULTRASOUND FINDINGS: Number of Fetuses: 1 Heart Rate:  144 bpm Movement: Visualized Presentation: Breech Placental Location: Anterior Previa: Absent Amniotic Fluid (Subjective):  Within normal limits.  AFI: 14.7 cm BPD: 4.6 cm 20 w  0 d MATERNAL FINDINGS: Cervix:  Closed, 4 5 cm Uterus/Adnexae: Posterior lower uterine segment fibroid measures 2.3 cm. Superior fibroid measures 6.5 cm. IMPRESSION: Single viable intrauterine pregnancy. Fetal heart rate 144 beats per minute. No acute maternal findings. Uterine fibroids. This exam is performed on an emergent basis and does not comprehensively evaluate fetal size, dating, or anatomy; follow-up complete OB US should be considered if further fetal assessment is warranted. Electronically Signed   By: Lennette Bihari  [place] M.D.   On: 03/24/2017 23:52    Procedures Procedures (including critical care time)  Medications Ordered in ED Medications - No data to display   Initial Impression / Assessment and Plan / ED Course  I have reviewed the triage vital signs and the nursing notes.  Pertinent labs & imaging results that were available during my care of the patient were reviewed by me and considered in my medical decision making (see chart for details).     Patient presents to the ER for evaluation of abdominal and pelvic pain in early pregnancy.  Patient is 19 weeks and 2 days by dates.  She has had some spotting but this has not changed throughout her pregnancy and is not acute.  Her workup is unremarkable.  This includes ultrasound which shows single live IUP at 19 weeks with heart rate of 144.  Patient reassured, continue Tylenol as needed.  This pain is most likely round ligament pain.  Final Clinical Impressions(s) / ED Diagnoses   Final diagnoses:  Round ligament pain    ED Discharge Orders    None       Dinh Ayotte, Gwenyth Allegra, MD 03/25/17 0120

## 2017-03-27 DIAGNOSIS — O3432 Maternal care for cervical incompetence, second trimester: Secondary | ICD-10-CM | POA: Insufficient documentation

## 2017-05-29 DIAGNOSIS — Z6841 Body Mass Index (BMI) 40.0 and over, adult: Secondary | ICD-10-CM | POA: Insufficient documentation

## 2017-05-29 HISTORY — DX: Body Mass Index (BMI) 40.0 and over, adult: Z684

## 2017-07-30 DIAGNOSIS — O4290 Premature rupture of membranes, unspecified as to length of time between rupture and onset of labor, unspecified weeks of gestation: Secondary | ICD-10-CM | POA: Insufficient documentation

## 2018-01-21 ENCOUNTER — Emergency Department (HOSPITAL_COMMUNITY)
Admission: EM | Admit: 2018-01-21 | Discharge: 2018-01-21 | Disposition: A | Discharge status: Home / Self Care | Payer: Medicaid Other | Attending: Emergency Medicine | Admitting: Emergency Medicine

## 2018-01-21 ENCOUNTER — Other Ambulatory Visit: Payer: Self-pay

## 2018-01-21 ENCOUNTER — Encounter (HOSPITAL_COMMUNITY): Payer: Self-pay | Admitting: Emergency Medicine

## 2018-01-21 DIAGNOSIS — R69 Illness, unspecified: Secondary | ICD-10-CM

## 2018-01-21 DIAGNOSIS — J111 Influenza due to unidentified influenza virus with other respiratory manifestations: Secondary | ICD-10-CM

## 2018-01-21 DIAGNOSIS — J101 Influenza due to other identified influenza virus with other respiratory manifestations: Secondary | ICD-10-CM | POA: Insufficient documentation

## 2018-01-21 LAB — INFLUENZA PANEL BY PCR (TYPE A & B)
INFLBPCR: POSITIVE — AB
Influenza A By PCR: NEGATIVE

## 2018-01-21 LAB — GROUP A STREP BY PCR: Group A Strep by PCR: NOT DETECTED

## 2018-01-21 LAB — URINALYSIS, ROUTINE W REFLEX MICROSCOPIC
Bilirubin Urine: NEGATIVE
GLUCOSE, UA: NEGATIVE mg/dL
Ketones, ur: NEGATIVE mg/dL
Leukocytes, UA: NEGATIVE
NITRITE: NEGATIVE
PH: 8 (ref 5.0–8.0)
Protein, ur: NEGATIVE mg/dL
Specific Gravity, Urine: 1.006 (ref 1.005–1.030)

## 2018-01-21 LAB — PREGNANCY, URINE: Preg Test, Ur: NEGATIVE

## 2018-01-21 MED ORDER — ONDANSETRON 8 MG PO TBDP: 8.0000 mg | ORAL_TABLET | Freq: Three times a day (TID) | ORAL | 0 refills | Status: DC | PRN

## 2018-01-21 MED ORDER — OSELTAMIVIR PHOSPHATE 75 MG PO CAPS
75.0000 mg | ORAL_CAPSULE | Freq: Two times a day (BID) | ORAL | 0 refills | Status: DC
Start: 2018-01-21 — End: 2023-03-23

## 2018-01-21 NOTE — ED Triage Notes (Signed)
Pt reports fever of 101.2 last night, generalized body aches, and stomach ache that started yesterday. Denies N/V. Afebrile in triage

## 2018-01-21 NOTE — Discharge Instructions (Addendum)
Take Zofran for nausea and vomiting, take Tylenol or other over-the-counter medications for fevers and body aches, as we discussed, Tamiflu is an anti-influenza medication, it may decrease the duration of your symptoms as well as the severity, however this medication is not absolutely necessary and most healthy people get over the flu without any additional medications, if you do decide to take the medication it needs to be started within the first 48 hours of sx onset

## 2018-01-21 NOTE — ED Provider Notes (Signed)
[facility] EMERGENCY DEPARTMENT Provider Note   CSN: [id number] Arrival date & time: 01/21/18  1124     History   Chief Complaint Influenza-like illness  HPI Gloria Yu is a 32 y.o. female.  HPI Patient presents emergency room for evaluation of fever, body aches, cough and congestion.  Patient states her symptoms started last evening.  She had a temperature up to 101.  She has had generalized body aches sore throat.  She had some generalized abdominal discomfort but no severe pain.  She had some nausea but no vomiting.  No diarrhea.  No dysuria. Past Medical History:  Diagnosis Date  . Claustrophobia   . Headache    migraines  . Lump of breast, right    removed at age 48  . Sickle cell trait [facility])     Patient Active Problem List   Diagnosis Date Noted  . Acute medial meniscus tear of right knee 11/05/2016    Past Surgical History:  Procedure Laterality Date  . BREAST LUMPECTOMY Right age 32   benign  . KNEE ARTHROSCOPY WITH MEDIAL MENISECTOMY Right 11/05/2016   Procedure: RIGHT KNEE ARTHROSCOPY WITH PARTIAL MEDIAL MENISCUS;  Surgeon: Nicholes Stairs, MD;  Location: [facility];  Service: Orthopedics;  Laterality: Right;  90 mins     OB History    Gravida  3   Para  1   Term  1   Preterm      AB  1   Living  1     SAB  1   TAB      Ectopic      Multiple      Live Births  1            Home Medications    Prior to Admission medications   Medication Sig Start Date End Date Taking? Authorizing Provider  acetaminophen (TYLENOL) 500 MG tablet Take 1,000 mg by mouth every 6 (six) hours as needed.    [provider]  cetirizine (ZYRTEC) 10 MG tablet Take 1 tablet (10 mg total) by mouth daily. 10/29/15   Frederica Kuster, PA-C  diphenhydrAMINE (BENADRYL) 25 MG tablet Take 25 mg by mouth as needed.    [provider]  Doxylamine-Pyridoxine 10-10 MG TBEC Day 1- Take 2 tablets, by mouth at  bedtime.Day 2- Take 2 tablets at bedtime. If your nausea and vomiting is better or controlled on Day2, continue to take 2 tablets every night at bedtime. Day 3- If you still had nausea and vomiting on Day 2, take 3 tablets on Day 3 1 tablet in the morning and 2 tablets at bedtime. nausea and vomiting on Day 3, start taking 4 tablets each day 1 tablet in the morning,1 tablet in the afternoon, and 2 tablets at bed 01/03/17   Lawyer, Harrell Gave, PA-C  HYDROcodone-acetaminophen (NORCO) 7.5-325 MG tablet Take 1-2 tablets by mouth every 6 (six) hours as needed for moderate pain. 11/05/16   Nicholes Stairs, MD  methocarbamol (ROBAXIN) 500 MG tablet Take 1 tablet (500 mg total) by mouth 2 (two) times daily. 03/06/16   Ashley Murrain, NP  ondansetron (ZOFRAN ODT) 8 MG disintegrating tablet Take 1 tablet (8 mg total) by mouth every 8 (eight) hours as needed for nausea or vomiting. 01/21/18   Dorie Rank, MD  oseltamivir (TAMIFLU) 75 MG capsule Take 1 capsule (75 mg total) by mouth 2 (two) times daily. 01/21/18   Dorie Rank, MD    Family  History No family history on file.  Social History Social History   Tobacco Use  . Smoking status: Never Smoker  . Smokeless tobacco: Never Used  Substance Use Topics  . Alcohol use: No  . Drug use: No     Allergies   Shellfish allergy; Aspirin; and Latex   Review of Systems Review of Systems  All other systems reviewed and are negative.    Physical Exam Updated Vital Signs BP 131/80   Pulse 78   Temp 98.5 F (36.9 C) (Oral)   Ht 1.6 m (5\' 3" )   Wt 120.2 kg   LMP 01/15/2018 (Exact Date)   SpO2 100%   Breastfeeding Unknown   BMI 46.94 kg/m   Physical Exam Vitals signs and nursing note reviewed.  Constitutional:      General: She is not in acute distress.    Appearance: She is well-developed.  HENT:     Head: Normocephalic and atraumatic.     Right Ear: External ear normal.     Left Ear: External ear normal.     Nose: Congestion present.       Mouth/Throat:     Pharynx: No oropharyngeal exudate or posterior oropharyngeal erythema.  Eyes:     General: No scleral icterus.       Right eye: No discharge.        Left eye: No discharge.     Conjunctiva/sclera: Conjunctivae normal.  Neck:     Musculoskeletal: Neck supple.     Trachea: No tracheal deviation.  Cardiovascular:     Rate and Rhythm: Normal rate and regular rhythm.  Pulmonary:     Effort: Pulmonary effort is normal. No respiratory distress.     Breath sounds: Normal breath sounds. No stridor. No wheezing or rales.  Abdominal:     General: Bowel sounds are normal. There is no distension.     Palpations: Abdomen is soft.     Tenderness: There is no abdominal tenderness. There is no guarding or rebound.  Musculoskeletal:        General: No tenderness.  Lymphadenopathy:     Cervical: No cervical adenopathy.  Skin:    General: Skin is warm and dry.     Findings: No rash.  Neurological:     Mental Status: She is alert.     Cranial Nerves: No cranial nerve deficit (no facial droop, extraocular movements intact, no slurred speech).     Sensory: No sensory deficit.     Motor: No abnormal muscle tone or seizure activity.     Coordination: Coordination normal.      ED Treatments / Results  Labs (all labs ordered are listed, but only abnormal results are displayed) Labs Reviewed  URINALYSIS, ROUTINE W REFLEX MICROSCOPIC - Abnormal; Notable for the following components:      Result Value   Color, Urine STRAW (*)    Hgb urine dipstick MODERATE (*)    Bacteria, UA RARE (*)    All other components within normal limits  GROUP A STREP BY PCR  PREGNANCY, URINE  INFLUENZA PANEL BY PCR (TYPE A & B)     Procedures Procedures (including critical care time)  Medications Ordered in ED Medications - No data to display   Initial Impression / Assessment and Plan / ED Course  I have reviewed the triage vital signs and the nursing notes.  Pertinent labs & imaging  results that were available during my care of the patient were reviewed by me and considered in  my medical decision making (see chart for details).  Clinical Course as of Jan 21 1614  Thu Jan 21, 2018  1615 Influenza pending.  Send out test today   [JK]    Clinical Course User Index [JK] Dorie Rank, MD  Strep test negative. UA negative for UTI.  Sx consistent with influenza like illness. Flu test sent off and is pending.  Pt will follow up on result since she has a 11 month old at home.    At this time there does not appear to be any evidence of an acute emergency medical condition and the patient appears stable for discharge with appropriate outpatient follow up.   Final Clinical Impressions(s) / ED Diagnoses   Final diagnoses:  Influenza-like illness    ED Discharge Orders         Ordered    ondansetron (ZOFRAN ODT) 8 MG disintegrating tablet  Every 8 hours PRN     01/21/18 1611    oseltamivir (TAMIFLU) 75 MG capsule  2 times daily     01/21/18 1611           Dorie Rank, MD 01/21/18 1615

## 2018-06-30 DIAGNOSIS — Z6841 Body Mass Index (BMI) 40.0 and over, adult: Secondary | ICD-10-CM | POA: Diagnosis not present

## 2018-06-30 DIAGNOSIS — Z0000 Encounter for general adult medical examination without abnormal findings: Secondary | ICD-10-CM | POA: Diagnosis not present

## 2018-07-27 DIAGNOSIS — Z1159 Encounter for screening for other viral diseases: Secondary | ICD-10-CM | POA: Diagnosis not present

## 2018-07-27 DIAGNOSIS — J069 Acute upper respiratory infection, unspecified: Secondary | ICD-10-CM | POA: Diagnosis not present

## 2018-11-22 DIAGNOSIS — J329 Chronic sinusitis, unspecified: Secondary | ICD-10-CM | POA: Diagnosis not present

## 2018-11-22 DIAGNOSIS — Z6841 Body Mass Index (BMI) 40.0 and over, adult: Secondary | ICD-10-CM | POA: Diagnosis not present

## 2018-11-23 DIAGNOSIS — Z20828 Contact with and (suspected) exposure to other viral communicable diseases: Secondary | ICD-10-CM | POA: Diagnosis not present

## 2018-11-29 DIAGNOSIS — Z6841 Body Mass Index (BMI) 40.0 and over, adult: Secondary | ICD-10-CM | POA: Diagnosis not present

## 2018-11-29 DIAGNOSIS — M79672 Pain in left foot: Secondary | ICD-10-CM | POA: Diagnosis not present

## 2018-12-10 DIAGNOSIS — M24572 Contracture, left ankle: Secondary | ICD-10-CM | POA: Insufficient documentation

## 2018-12-10 DIAGNOSIS — M722 Plantar fascial fibromatosis: Secondary | ICD-10-CM | POA: Insufficient documentation

## 2018-12-10 HISTORY — DX: Plantar fascial fibromatosis: M72.2

## 2018-12-10 HISTORY — DX: Contracture, left ankle: M24.572

## 2019-01-05 DIAGNOSIS — G43109 Migraine with aura, not intractable, without status migrainosus: Secondary | ICD-10-CM | POA: Diagnosis not present

## 2019-01-05 DIAGNOSIS — G43909 Migraine, unspecified, not intractable, without status migrainosus: Secondary | ICD-10-CM | POA: Diagnosis not present

## 2019-01-05 DIAGNOSIS — Z6841 Body Mass Index (BMI) 40.0 and over, adult: Secondary | ICD-10-CM | POA: Diagnosis not present

## 2019-02-17 DIAGNOSIS — Z87410 Personal history of cervical dysplasia: Secondary | ICD-10-CM | POA: Diagnosis not present

## 2019-02-17 DIAGNOSIS — N926 Irregular menstruation, unspecified: Secondary | ICD-10-CM

## 2019-02-17 DIAGNOSIS — Z8759 Personal history of other complications of pregnancy, childbirth and the puerperium: Secondary | ICD-10-CM | POA: Diagnosis not present

## 2019-02-17 DIAGNOSIS — Z9889 Other specified postprocedural states: Secondary | ICD-10-CM | POA: Diagnosis not present

## 2019-02-17 DIAGNOSIS — R87610 Atypical squamous cells of undetermined significance on cytologic smear of cervix (ASC-US): Secondary | ICD-10-CM | POA: Diagnosis not present

## 2019-02-17 DIAGNOSIS — Z124 Encounter for screening for malignant neoplasm of cervix: Secondary | ICD-10-CM | POA: Diagnosis not present

## 2019-02-17 DIAGNOSIS — Z1151 Encounter for screening for human papillomavirus (HPV): Secondary | ICD-10-CM | POA: Diagnosis not present

## 2019-02-17 DIAGNOSIS — Z30014 Encounter for initial prescription of intrauterine contraceptive device: Secondary | ICD-10-CM | POA: Diagnosis not present

## 2019-02-17 HISTORY — DX: Irregular menstruation, unspecified: N92.6

## 2019-02-19 DIAGNOSIS — D259 Leiomyoma of uterus, unspecified: Secondary | ICD-10-CM | POA: Diagnosis not present

## 2019-02-19 DIAGNOSIS — R1033 Periumbilical pain: Secondary | ICD-10-CM | POA: Diagnosis not present

## 2019-02-19 DIAGNOSIS — N83202 Unspecified ovarian cyst, left side: Secondary | ICD-10-CM | POA: Diagnosis not present

## 2019-02-19 DIAGNOSIS — N8500 Endometrial hyperplasia, unspecified: Secondary | ICD-10-CM | POA: Diagnosis not present

## 2019-02-19 DIAGNOSIS — R1031 Right lower quadrant pain: Secondary | ICD-10-CM | POA: Diagnosis not present

## 2019-02-19 DIAGNOSIS — N839 Noninflammatory disorder of ovary, fallopian tube and broad ligament, unspecified: Secondary | ICD-10-CM | POA: Diagnosis not present

## 2019-02-23 DIAGNOSIS — N926 Irregular menstruation, unspecified: Secondary | ICD-10-CM | POA: Diagnosis not present

## 2019-03-03 DIAGNOSIS — O039 Complete or unspecified spontaneous abortion without complication: Secondary | ICD-10-CM | POA: Diagnosis not present

## 2019-03-04 DIAGNOSIS — R0981 Nasal congestion: Secondary | ICD-10-CM | POA: Diagnosis not present

## 2019-03-04 DIAGNOSIS — Z20828 Contact with and (suspected) exposure to other viral communicable diseases: Secondary | ICD-10-CM | POA: Diagnosis not present

## 2019-03-08 DIAGNOSIS — Z3202 Encounter for pregnancy test, result negative: Secondary | ICD-10-CM | POA: Diagnosis not present

## 2019-03-08 DIAGNOSIS — Z30430 Encounter for insertion of intrauterine contraceptive device: Secondary | ICD-10-CM | POA: Diagnosis not present

## 2019-03-17 DIAGNOSIS — Z975 Presence of (intrauterine) contraceptive device: Secondary | ICD-10-CM | POA: Insufficient documentation

## 2019-03-17 HISTORY — DX: Presence of (intrauterine) contraceptive device: Z97.5

## 2019-04-22 DIAGNOSIS — M2550 Pain in unspecified joint: Secondary | ICD-10-CM | POA: Diagnosis not present

## 2019-04-22 DIAGNOSIS — R252 Cramp and spasm: Secondary | ICD-10-CM | POA: Diagnosis not present

## 2019-04-22 DIAGNOSIS — Z6841 Body Mass Index (BMI) 40.0 and over, adult: Secondary | ICD-10-CM | POA: Diagnosis not present

## 2019-05-15 DIAGNOSIS — D251 Intramural leiomyoma of uterus: Secondary | ICD-10-CM | POA: Insufficient documentation

## 2019-05-15 HISTORY — DX: Intramural leiomyoma of uterus: D25.1

## 2019-06-21 DIAGNOSIS — J029 Acute pharyngitis, unspecified: Secondary | ICD-10-CM | POA: Diagnosis not present

## 2019-06-21 DIAGNOSIS — R0982 Postnasal drip: Secondary | ICD-10-CM | POA: Diagnosis not present

## 2019-07-14 DIAGNOSIS — L0889 Other specified local infections of the skin and subcutaneous tissue: Secondary | ICD-10-CM | POA: Diagnosis not present

## 2019-08-10 DIAGNOSIS — G43109 Migraine with aura, not intractable, without status migrainosus: Secondary | ICD-10-CM | POA: Diagnosis not present

## 2019-08-17 ENCOUNTER — Emergency Department (HOSPITAL_COMMUNITY)
Admission: EM | Admit: 2019-08-17 | Discharge: 2019-08-18 | Disposition: A | Discharge status: Home / Self Care | Payer: Federal, State, Local not specified - PPO | Attending: Emergency Medicine | Admitting: Emergency Medicine

## 2019-08-17 ENCOUNTER — Other Ambulatory Visit: Payer: Self-pay

## 2019-08-17 ENCOUNTER — Encounter (HOSPITAL_COMMUNITY): Payer: Self-pay | Admitting: Emergency Medicine

## 2019-08-17 DIAGNOSIS — R519 Headache, unspecified: Secondary | ICD-10-CM | POA: Insufficient documentation

## 2019-08-17 DIAGNOSIS — M79602 Pain in left arm: Secondary | ICD-10-CM | POA: Insufficient documentation

## 2019-08-17 DIAGNOSIS — Z5321 Procedure and treatment not carried out due to patient leaving prior to being seen by health care provider: Secondary | ICD-10-CM | POA: Diagnosis not present

## 2019-08-17 DIAGNOSIS — R109 Unspecified abdominal pain: Secondary | ICD-10-CM | POA: Insufficient documentation

## 2019-08-17 LAB — URINALYSIS, ROUTINE W REFLEX MICROSCOPIC
Bilirubin Urine: NEGATIVE
Glucose, UA: NEGATIVE mg/dL
Ketones, ur: 5 mg/dL — AB
Nitrite: NEGATIVE
Protein, ur: NEGATIVE mg/dL
Specific Gravity, Urine: 1.025 (ref 1.005–1.030)
pH: 5 (ref 5.0–8.0)

## 2019-08-17 LAB — CBC
HCT: 40.2 % (ref 36.0–46.0)
Hemoglobin: 13.9 g/dL (ref 12.0–15.0)
MCH: 27.1 pg (ref 26.0–34.0)
MCHC: 34.6 g/dL (ref 30.0–36.0)
MCV: 78.4 fL — ABNORMAL LOW (ref 80.0–100.0)
Platelets: 432 10*3/uL — ABNORMAL HIGH (ref 150–400)
RBC: 5.13 MIL/uL — ABNORMAL HIGH (ref 3.87–5.11)
RDW: 14.2 % (ref 11.5–15.5)
WBC: 6.7 10*3/uL (ref 4.0–10.5)
nRBC: 0 % (ref 0.0–0.2)

## 2019-08-17 MED ORDER — SODIUM CHLORIDE 0.9% FLUSH: 3.0000 mL | Freq: Once | INTRAVENOUS | Status: DC

## 2019-08-17 NOTE — ED Triage Notes (Signed)
Patient reports hypogastric pain these evening , no emesis or diarrhea , denies fever or dysuria , patient added left temporal headache and left arm pain , denies injury.

## 2019-08-18 DIAGNOSIS — M79602 Pain in left arm: Secondary | ICD-10-CM | POA: Diagnosis not present

## 2019-08-18 DIAGNOSIS — R519 Headache, unspecified: Secondary | ICD-10-CM | POA: Diagnosis not present

## 2019-08-18 DIAGNOSIS — M25512 Pain in left shoulder: Secondary | ICD-10-CM | POA: Diagnosis not present

## 2019-08-18 DIAGNOSIS — Z3202 Encounter for pregnancy test, result negative: Secondary | ICD-10-CM | POA: Diagnosis not present

## 2019-08-18 DIAGNOSIS — R1030 Lower abdominal pain, unspecified: Secondary | ICD-10-CM | POA: Diagnosis not present

## 2019-08-18 DIAGNOSIS — R350 Frequency of micturition: Secondary | ICD-10-CM | POA: Diagnosis not present

## 2019-08-18 LAB — COMPREHENSIVE METABOLIC PANEL
ALT: 23 U/L (ref 0–44)
AST: 18 U/L (ref 15–41)
Albumin: 3.7 g/dL (ref 3.5–5.0)
Alkaline Phosphatase: 91 U/L (ref 38–126)
Anion gap: 8 (ref 5–15)
BUN: 14 mg/dL (ref 6–20)
CO2: 27 mmol/L (ref 22–32)
Calcium: 9.6 mg/dL (ref 8.9–10.3)
Chloride: 104 mmol/L (ref 98–111)
Creatinine, Ser: 0.78 mg/dL (ref 0.44–1.00)
GFR calc Af Amer: >60 mL/min (ref >60)
GFR calc non Af Amer: >60 mL/min (ref >60)
Glucose, Bld: 81 mg/dL (ref 70–99)
Potassium: 4 mmol/L (ref 3.5–5.1)
Sodium: 139 mmol/L (ref 135–145)
Total Bilirubin: 0.5 mg/dL (ref 0.3–1.2)
Total Protein: 8 g/dL (ref 6.5–8.1)

## 2019-08-18 LAB — LIPASE, BLOOD: Lipase: 28 U/L (ref 11–51)

## 2019-08-18 LAB — I-STAT BETA HCG BLOOD, ED (MC, WL, AP ONLY): I-stat hCG, quantitative: <5 m[IU]/mL (ref <5)

## 2019-08-18 NOTE — ED Notes (Signed)
Pt checked out AMA. 

## 2019-08-26 DIAGNOSIS — M65849 Other synovitis and tenosynovitis, unspecified hand: Secondary | ICD-10-CM | POA: Diagnosis not present

## 2019-09-12 DIAGNOSIS — D252 Subserosal leiomyoma of uterus: Secondary | ICD-10-CM | POA: Diagnosis not present

## 2019-09-12 DIAGNOSIS — M79662 Pain in left lower leg: Secondary | ICD-10-CM | POA: Diagnosis not present

## 2019-09-12 DIAGNOSIS — D251 Intramural leiomyoma of uterus: Secondary | ICD-10-CM | POA: Diagnosis not present

## 2019-09-12 DIAGNOSIS — Z87410 Personal history of cervical dysplasia: Secondary | ICD-10-CM | POA: Diagnosis not present

## 2019-09-12 DIAGNOSIS — Z9889 Other specified postprocedural states: Secondary | ICD-10-CM | POA: Diagnosis not present

## 2019-09-12 DIAGNOSIS — Z975 Presence of (intrauterine) contraceptive device: Secondary | ICD-10-CM | POA: Diagnosis not present

## 2019-09-12 DIAGNOSIS — R208 Other disturbances of skin sensation: Secondary | ICD-10-CM | POA: Diagnosis not present

## 2019-09-13 DIAGNOSIS — R208 Other disturbances of skin sensation: Secondary | ICD-10-CM | POA: Diagnosis not present

## 2019-09-13 DIAGNOSIS — R200 Anesthesia of skin: Secondary | ICD-10-CM | POA: Diagnosis not present

## 2019-09-26 ENCOUNTER — Other Ambulatory Visit: Payer: Self-pay | Admitting: Podiatry

## 2019-09-26 ENCOUNTER — Ambulatory Visit: Payer: Self-pay | Admitting: Podiatry

## 2019-09-26 DIAGNOSIS — N6009 Solitary cyst of unspecified breast: Secondary | ICD-10-CM | POA: Insufficient documentation

## 2019-09-26 DIAGNOSIS — Z8742 Personal history of other diseases of the female genital tract: Secondary | ICD-10-CM | POA: Insufficient documentation

## 2019-09-26 DIAGNOSIS — N871 Moderate cervical dysplasia: Secondary | ICD-10-CM

## 2019-09-26 DIAGNOSIS — M79671 Pain in right foot: Secondary | ICD-10-CM

## 2019-09-26 DIAGNOSIS — M79672 Pain in left foot: Secondary | ICD-10-CM

## 2019-09-26 HISTORY — DX: Moderate cervical dysplasia: N87.1

## 2019-09-26 HISTORY — DX: Solitary cyst of unspecified breast: N60.09

## 2019-09-26 HISTORY — DX: Personal history of other diseases of the female genital tract: Z87.42

## 2019-09-29 ENCOUNTER — Ambulatory Visit: Payer: Self-pay | Admitting: Podiatry

## 2019-10-07 ENCOUNTER — Ambulatory Visit: Payer: Self-pay | Admitting: Sports Medicine

## 2019-12-28 DIAGNOSIS — B373 Candidiasis of vulva and vagina: Secondary | ICD-10-CM | POA: Diagnosis not present

## 2019-12-28 DIAGNOSIS — Z6841 Body Mass Index (BMI) 40.0 and over, adult: Secondary | ICD-10-CM | POA: Diagnosis not present

## 2020-02-14 DIAGNOSIS — N898 Other specified noninflammatory disorders of vagina: Secondary | ICD-10-CM | POA: Diagnosis not present

## 2020-02-15 DIAGNOSIS — Z1151 Encounter for screening for human papillomavirus (HPV): Secondary | ICD-10-CM | POA: Diagnosis not present

## 2020-02-15 DIAGNOSIS — Z124 Encounter for screening for malignant neoplasm of cervix: Secondary | ICD-10-CM | POA: Diagnosis not present

## 2020-02-15 DIAGNOSIS — Z01419 Encounter for gynecological examination (general) (routine) without abnormal findings: Secondary | ICD-10-CM | POA: Diagnosis not present

## 2020-02-15 DIAGNOSIS — N9089 Other specified noninflammatory disorders of vulva and perineum: Secondary | ICD-10-CM | POA: Diagnosis not present

## 2020-08-18 DIAGNOSIS — S86912A Strain of unspecified muscle(s) and tendon(s) at lower leg level, left leg, initial encounter: Secondary | ICD-10-CM | POA: Diagnosis not present

## 2020-08-18 DIAGNOSIS — M25562 Pain in left knee: Secondary | ICD-10-CM | POA: Diagnosis not present

## 2020-08-19 DIAGNOSIS — R509 Fever, unspecified: Secondary | ICD-10-CM | POA: Diagnosis not present

## 2020-08-19 DIAGNOSIS — J069 Acute upper respiratory infection, unspecified: Secondary | ICD-10-CM | POA: Diagnosis not present

## 2020-08-19 DIAGNOSIS — Z20828 Contact with and (suspected) exposure to other viral communicable diseases: Secondary | ICD-10-CM | POA: Diagnosis not present

## 2020-08-20 DIAGNOSIS — J329 Chronic sinusitis, unspecified: Secondary | ICD-10-CM | POA: Diagnosis not present

## 2020-08-28 ENCOUNTER — Emergency Department (HOSPITAL_BASED_OUTPATIENT_CLINIC_OR_DEPARTMENT_OTHER)
Admission: EM | Admit: 2020-08-28 | Discharge: 2020-08-28 | Disposition: A | Discharge status: Home / Self Care | Payer: Federal, State, Local not specified - PPO | Attending: Emergency Medicine | Admitting: Emergency Medicine

## 2020-08-28 ENCOUNTER — Other Ambulatory Visit (HOSPITAL_BASED_OUTPATIENT_CLINIC_OR_DEPARTMENT_OTHER): Payer: Self-pay

## 2020-08-28 ENCOUNTER — Encounter (HOSPITAL_BASED_OUTPATIENT_CLINIC_OR_DEPARTMENT_OTHER): Payer: Self-pay

## 2020-08-28 ENCOUNTER — Other Ambulatory Visit: Payer: Self-pay

## 2020-08-28 DIAGNOSIS — Y9339 Activity, other involving climbing, rappelling and jumping off: Secondary | ICD-10-CM | POA: Insufficient documentation

## 2020-08-28 DIAGNOSIS — M25562 Pain in left knee: Secondary | ICD-10-CM | POA: Diagnosis not present

## 2020-08-28 DIAGNOSIS — W1839XA Other fall on same level, initial encounter: Secondary | ICD-10-CM | POA: Diagnosis not present

## 2020-08-28 DIAGNOSIS — Z91040 Latex allergy status: Secondary | ICD-10-CM | POA: Diagnosis not present

## 2020-08-28 MED ORDER — HYDROCODONE-ACETAMINOPHEN 5-325 MG PO TABS
1.0000 | ORAL_TABLET | ORAL | 0 refills | Status: DC | PRN
  Filled 2020-08-28: qty 10, 2d supply, fill #0

## 2020-08-28 MED ORDER — HYDROCODONE-ACETAMINOPHEN 5-325 MG PO TABS
1.0000 | ORAL_TABLET | Freq: Once | ORAL | Status: AC
Start: 2020-08-28 — End: 2020-08-28
  Administered 2020-08-28: 1 via ORAL
  Filled 2020-08-28: qty 1

## 2020-08-28 NOTE — ED Provider Notes (Signed)
[place] EMERGENCY DEPARTMENT Provider Note   CSN: [id number] Arrival date & time: 08/28/20  1303     History Chief Complaint  Patient presents with   Knee Pain    Gloria Yu is a 35 y.o. female.  HPI 35 year old female presents with left knee pain.  This has been ongoing for about 9 days.  She went to [facility] and had a knee x-ray and was told she had a knee sprain.  She does remember jumping on her husband's back and then thinking she was falling and having to jump off.  She does remember specifically injuring it then but that could have caused some pain.  Since then she has been having left knee pain that is worse with movement and bearing weight.  She previously had a meniscus tear to the right knee and this counter feels similar.  She had to have surgery with [facility].  No fevers, skin color change.  She was prescribed meloxicam and a muscle relaxer but does not feel like it is helping.  She was told the x-ray showed may be some small amount of fluid but no bony injury.  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   Cyst of breast 09/26/2019   Hx of abnormal cervical Pap smear 09/26/2019   Intramural leiomyoma of uterus 05/15/2019   IUD (intrauterine device) in place 03/17/2019   Irregular bleeding 02/17/2019   Ankle contracture, left 12/10/2018   Plantar fasciitis of left foot 12/10/2018   SVD (spontaneous vaginal delivery) 08/02/2017   Premature rupture of membranes 07/30/2017   BMI 50.0-59.9, adult ([place]) 05/29/2017   Cervical insufficiency during pregnancy in second trimester, antepartum 03/27/2017   Acute medial meniscus tear of right knee 11/05/2016   Missed abortion 06/03/2016   Other specified  postprocedural states 07/05/2015   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  Social History   Tobacco Use   Smoking status: Never   Smokeless tobacco: Never  Vaping Use   Vaping Use: Never used  Substance Use Topics   Alcohol use: No   Drug use: No    Home Medications Prior to Admission medications   Medication Sig Start Date End Date Taking? Authorizing Provider  HYDROcodone-acetaminophen (NORCO) 5-325 MG tablet Take 1 tablet by mouth every 4 (four) hours as needed. 08/28/20  Yes Sherwood Gambler, MD  acetaminophen (  TYLENOL) 500 MG tablet Take 1,000 mg by mouth every 6 (six) hours as needed.    [provider]  cetirizine (ZYRTEC) 10 MG tablet Take 1 tablet (10 mg total) by mouth daily. 10/29/15   Frederica Kuster, PA-C  diphenhydrAMINE (BENADRYL) 25 MG tablet Take 25 mg by mouth as needed.    [provider]  Doxylamine-Pyridoxine 10-10 MG TBEC Day 1- Take 2 tablets, by mouth at bedtime.Day 2- Take 2 tablets at bedtime. If your nausea and vomiting is better or controlled on Day2, continue to take 2 tablets every night at bedtime. Day 3- If you still had nausea and vomiting on Day 2, take 3 tablets on Day 3 1 tablet in the morning and 2 tablets at bedtime. nausea and vomiting on Day 3, start taking 4 tablets each day 1 tablet in the morning,1 tablet in the afternoon, and 2 tablets at bed 01/03/17   Lawyer,  Harrell Gave, PA-C  methocarbamol (ROBAXIN) 500 MG tablet Take 1 tablet (500 mg total) by mouth 2 (two) times daily. 03/06/16   Ashley Murrain, NP  ondansetron (ZOFRAN ODT) 8 MG disintegrating tablet Take 1 tablet (8 mg total) by mouth every 8 (eight) hours as needed for nausea or vomiting. 01/21/18   Dorie Rank, MD  oseltamivir (TAMIFLU) 75 MG capsule Take 1 capsule (75 mg total) by mouth 2 (two) times daily. 01/21/18   Dorie Rank, MD    Allergies    Shellfish allergy, Aspirin, and Latex  Review of Systems   Review of Systems  Constitutional:  Negative for fever.  Musculoskeletal:  Positive for arthralgias. Negative for joint swelling.  Skin:  Negative for color change.   Physical Exam Updated Vital Signs BP 120/74 (BP Location: Right Arm)   Pulse 79   Temp 98.4 F (36.9 C) (Oral)   Resp 20   Ht '5\' 3"'$  (1.6 m)   Wt 135.6 kg   LMP 08/10/2020   SpO2 99%   BMI 52.97 kg/m   Physical Exam Vitals and nursing note reviewed.  Constitutional:      Appearance: She is well-developed. She is obese.  HENT:     Head: Normocephalic and atraumatic.     Right Ear: External ear normal.     Left Ear: External ear normal.     Nose: Nose normal.  Eyes:     General:        Right eye: No discharge.        Left eye: No discharge.  Cardiovascular:     Rate and Rhythm: Normal rate and regular rhythm.     Pulses:          Dorsalis pedis pulses are 2+ on the left side.  Pulmonary:     Effort: Pulmonary effort is normal.  Abdominal:     General: There is no distension.  Musculoskeletal:     Left knee: No swelling, erythema or ecchymosis. Decreased range of motion (is painful, but i can passively range her knee with mild pain). Tenderness present.  Skin:    General: Skin is warm and dry.     Findings: No erythema.  Neurological:     Mental Status: She is alert.  Psychiatric:        Mood and Affect: Mood is not anxious.    ED Results / Procedures / Treatments   Labs (all labs ordered are  listed, but only abnormal results are displayed) Labs Reviewed - No data to display  EKG None  Radiology  No results found.  Procedures Procedures   Medications Ordered in ED Medications  HYDROcodone-acetaminophen (NORCO/VICODIN) 5-325 MG per tablet 1 tablet (1 tablet Oral Given 08/28/20 1356)    ED Course  I have reviewed the triage vital signs and the nursing notes.  Pertinent labs & imaging results that were available during my care of the patient were reviewed by me and considered in my medical decision making (see chart for details).    MDM Rules/Calculators/A&P                           There is no obvious effusion but the exam is limited by obesity.  There is no clear warmth or erythema to suggest obvious infection.  My suspicion that she has a septic knee is pretty low.  I discussed we could do a repeat x-ray to make sure there is no occult fracture that was missed originally but she declines.  Overall if this did start with the time she jumped off her husband's back this could be consistent with a ligamentous/meniscal injury.  I think it be reasonable to put her in a knee immobilizer with a short course of pain control and have her follow-up with her orthopedist in [facility].  I do not think an arthrocentesis is warranted at this time.  May need outpatient MRI.  Continue the meloxicam. Final Clinical Impression(s) / ED Diagnoses Final diagnoses:  Acute pain of left knee    Rx / DC Orders ED Discharge Orders          Ordered    HYDROcodone-acetaminophen (NORCO) 5-325 MG tablet  Every 4 hours PRN        08/28/20 1357             Sherwood Gambler, MD 08/28/20 1413

## 2020-08-28 NOTE — ED Triage Notes (Signed)
Pt c/o left knee pain x 1 week-denies injury-states she was seen at [facility] ED where she had xray and rx meds-seen by PCP and is waiting for ortho referral-NAD-to triage in w/c

## 2020-08-28 NOTE — Discharge Instructions (Addendum)
If you develop new or worsening knee pain, redness to your knee, excessive warmth to your knee, fever, or any other new/concerning symptoms then return to the ER for evaluation.

## 2020-09-03 DIAGNOSIS — M25562 Pain in left knee: Secondary | ICD-10-CM | POA: Diagnosis not present

## 2020-09-21 DIAGNOSIS — R69 Illness, unspecified: Secondary | ICD-10-CM | POA: Diagnosis not present

## 2020-09-21 DIAGNOSIS — R300 Dysuria: Secondary | ICD-10-CM | POA: Diagnosis not present

## 2020-09-21 DIAGNOSIS — Z6841 Body Mass Index (BMI) 40.0 and over, adult: Secondary | ICD-10-CM | POA: Diagnosis not present

## 2020-09-24 DIAGNOSIS — Z6841 Body Mass Index (BMI) 40.0 and over, adult: Secondary | ICD-10-CM | POA: Diagnosis not present

## 2020-09-24 DIAGNOSIS — K529 Noninfective gastroenteritis and colitis, unspecified: Secondary | ICD-10-CM | POA: Diagnosis not present

## 2020-09-28 ENCOUNTER — Emergency Department (HOSPITAL_BASED_OUTPATIENT_CLINIC_OR_DEPARTMENT_OTHER): Payer: Federal, State, Local not specified - PPO

## 2020-09-28 ENCOUNTER — Other Ambulatory Visit (HOSPITAL_BASED_OUTPATIENT_CLINIC_OR_DEPARTMENT_OTHER): Payer: Self-pay

## 2020-09-28 ENCOUNTER — Other Ambulatory Visit: Payer: Self-pay

## 2020-09-28 ENCOUNTER — Emergency Department (HOSPITAL_BASED_OUTPATIENT_CLINIC_OR_DEPARTMENT_OTHER)
Admission: EM | Admit: 2020-09-28 | Discharge: 2020-09-28 | Disposition: A | Discharge status: Home / Self Care | Payer: Federal, State, Local not specified - PPO | Attending: Emergency Medicine | Admitting: Emergency Medicine

## 2020-09-28 ENCOUNTER — Encounter (HOSPITAL_BASED_OUTPATIENT_CLINIC_OR_DEPARTMENT_OTHER): Payer: Self-pay | Admitting: *Deleted

## 2020-09-28 DIAGNOSIS — Z91040 Latex allergy status: Secondary | ICD-10-CM | POA: Insufficient documentation

## 2020-09-28 DIAGNOSIS — R1030 Lower abdominal pain, unspecified: Secondary | ICD-10-CM | POA: Insufficient documentation

## 2020-09-28 DIAGNOSIS — E876 Hypokalemia: Secondary | ICD-10-CM | POA: Insufficient documentation

## 2020-09-28 DIAGNOSIS — R079 Chest pain, unspecified: Secondary | ICD-10-CM | POA: Diagnosis not present

## 2020-09-28 DIAGNOSIS — R0789 Other chest pain: Secondary | ICD-10-CM | POA: Insufficient documentation

## 2020-09-28 LAB — URINALYSIS, ROUTINE W REFLEX MICROSCOPIC
Bilirubin Urine: NEGATIVE
Glucose, UA: NEGATIVE mg/dL
Hgb urine dipstick: NEGATIVE
Ketones, ur: NEGATIVE mg/dL
Leukocytes,Ua: NEGATIVE
Nitrite: NEGATIVE
Protein, ur: NEGATIVE mg/dL
Specific Gravity, Urine: 1.025 (ref 1.005–1.030)
pH: 6 (ref 5.0–8.0)

## 2020-09-28 LAB — COMPREHENSIVE METABOLIC PANEL
ALT: 18 U/L (ref 0–44)
AST: 14 U/L — ABNORMAL LOW (ref 15–41)
Albumin: 3.9 g/dL (ref 3.5–5.0)
Alkaline Phosphatase: 74 U/L (ref 38–126)
Anion gap: 7 (ref 5–15)
BUN: 11 mg/dL (ref 6–20)
CO2: 25 mmol/L (ref 22–32)
Calcium: 9.2 mg/dL (ref 8.9–10.3)
Chloride: 105 mmol/L (ref 98–111)
Creatinine, Ser: 0.71 mg/dL (ref 0.44–1.00)
GFR, Estimated: >60 mL/min (ref >60)
Glucose, Bld: 95 mg/dL (ref 70–99)
Potassium: 3.4 mmol/L — ABNORMAL LOW (ref 3.5–5.1)
Sodium: 137 mmol/L (ref 135–145)
Total Bilirubin: 0.3 mg/dL (ref 0.3–1.2)
Total Protein: 7.9 g/dL (ref 6.5–8.1)

## 2020-09-28 LAB — CBC WITH DIFFERENTIAL/PLATELET
Abs Immature Granulocytes: 0.02 10*3/uL (ref 0.00–0.07)
Basophils Absolute: 0 10*3/uL (ref 0.0–0.1)
Basophils Relative: 0 %
Eosinophils Absolute: 0 10*3/uL (ref 0.0–0.5)
Eosinophils Relative: 0 %
HCT: 39.9 % (ref 36.0–46.0)
Hemoglobin: 14 g/dL (ref 12.0–15.0)
Immature Granulocytes: 0 %
Lymphocytes Relative: 26 %
Lymphs Abs: 2.6 10*3/uL (ref 0.7–4.0)
MCH: 27.5 pg (ref 26.0–34.0)
MCHC: 35.1 g/dL (ref 30.0–36.0)
MCV: 78.4 fL — ABNORMAL LOW (ref 80.0–100.0)
Monocytes Absolute: 0.9 10*3/uL (ref 0.1–1.0)
Monocytes Relative: 9 %
Neutro Abs: 6.4 10*3/uL (ref 1.7–7.7)
Neutrophils Relative %: 65 %
Platelets: 413 10*3/uL — ABNORMAL HIGH (ref 150–400)
RBC: 5.09 MIL/uL (ref 3.87–5.11)
RDW: 14.3 % (ref 11.5–15.5)
WBC: 10 10*3/uL (ref 4.0–10.5)
nRBC: 0 % (ref 0.0–0.2)

## 2020-09-28 LAB — D-DIMER, QUANTITATIVE: D-Dimer, Quant: 0.32 ug/mL-FEU (ref 0.00–0.50)

## 2020-09-28 LAB — PREGNANCY, URINE: Preg Test, Ur: NEGATIVE

## 2020-09-28 LAB — TROPONIN I (HIGH SENSITIVITY): Troponin I (High Sensitivity): <2 ng/L (ref <18)

## 2020-09-28 LAB — LIPASE, BLOOD: Lipase: 26 U/L (ref 11–51)

## 2020-09-28 MED ORDER — ALUM & MAG HYDROXIDE-SIMETH 200-200-20 MG/5ML PO SUSP
30.0000 mL | Freq: Once | ORAL | Status: AC
  Administered 2020-09-28: 30 mL via ORAL
  Filled 2020-09-28: qty 30

## 2020-09-28 MED ORDER — METHOCARBAMOL 500 MG PO TABS
500.0000 mg | ORAL_TABLET | Freq: Two times a day (BID) | ORAL | 0 refills | Status: DC
  Filled 2020-09-28: qty 20, 10d supply, fill #0

## 2020-09-28 MED ORDER — LIDOCAINE VISCOUS HCL 2 % MT SOLN
15.0000 mL | Freq: Once | OROMUCOSAL | Status: AC
  Administered 2020-09-28: 15 mL via ORAL
  Filled 2020-09-28: qty 15

## 2020-09-28 NOTE — ED Provider Notes (Signed)
[facility] EMERGENCY DEPARTMENT Provider Note   CSN: [id number] Arrival date & time: 09/28/20  1202     History Chief Complaint  Patient presents with   Chest Pain   Abdominal Pain    Gloria Yu is a 35 y.o. female.  HPI   Pt is a 35 y/o female with a h/o headaches, sickle cell trait who presents to the ED today for eval of chest pain. Pt  c/o chest pain that started about 5 days ago. She states the pain is located on the right side of the chest/back and radiates to the left side of the chest. She was seen by her provider about this and was started on prednisone for a pulled muscle. States that the prednisone did help her symptoms but it has not completely resolved sxs. She describes it as pulling sensation. She denies sob, pleuritic pain, cough, ble swelling, surgeries, hospital admissions, extended travel, h/o vte or ca. No on estrogen.  She notes she has had a lot of recent heavy lifting due to an upcoming move and also did her hair prior to the onset of her symptoms which required having her arms in the air for long period of time.  She does not know if this is contributed to her symptoms.   She notes that last week she started to have lower abd pain. She was seen by a provider and was started on abx for a uti and colitis. Pain is located to the lower abdomen and radiates to the llq. She states that she was started on cipro and flagyl and states that abdominal pain has improved significantly since starting the abx. Denies fevers, nausea, vomiting, dysuria, frequency, abnormal vaginal discharge She initially had diarrhea which has improved. Finished cipro last Friday. Started flagyl on Tuesday.  Pt denies concern for STD. She states she is monogamous with her husband and does not have concern for STD. She states she is not concerned about her abdominal symptoms as these are all improving since starting medications. Her reason for coming to the ED today was for evaluation of her  chest discomfort. She notes that her father has a h/o CHF s/p heart transplant and she is concerned about her heart.   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   Cyst of breast 09/26/2019   Hx of abnormal cervical Pap smear 09/26/2019   Intramural leiomyoma of uterus 05/15/2019   IUD (intrauterine device) in place 03/17/2019   Irregular bleeding 02/17/2019   Ankle contracture, left 12/10/2018   Plantar fasciitis of left foot 12/10/2018   SVD (spontaneous vaginal delivery) 08/02/2017   Premature rupture of membranes 07/30/2017   BMI 50.0-59.9, adult ([place]) 05/29/2017   Cervical insufficiency during pregnancy in second trimester, antepartum 03/27/2017   Acute medial meniscus tear of right knee 11/05/2016   Missed abortion 06/03/2016   Other specified postprocedural states 07/05/2015   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  Social History   Tobacco Use   Smoking status: Never   Smokeless tobacco: Never  Vaping Use   Vaping Use: Never used  Substance Use Topics   Alcohol use: No   Drug use: No    Home Medications Prior to Admission medications   Medication Sig Start Date End Date Taking?  Authorizing Provider  acetaminophen (TYLENOL) 500 MG tablet Take 1,000 mg by mouth every 6 (six) hours as needed.    [provider]  cetirizine (ZYRTEC) 10 MG tablet Take 1 tablet (10 mg total) by mouth daily. 10/29/15   Frederica Kuster, PA-C  diphenhydrAMINE (BENADRYL) 25 MG tablet Take 25 mg by mouth as needed.    [provider]  Doxylamine-Pyridoxine 10-10 MG TBEC Day 1- Take 2 tablets, by mouth at bedtime.Day 2- Take 2 tablets at bedtime. If your nausea and vomiting is better or controlled on Day2, continue to take 2 tablets every night at bedtime. Day 3- If you still had nausea and vomiting on Day 2, take 3 tablets on Day 3 1 tablet in the morning and 2 tablets at bedtime. nausea and vomiting on Day 3, start taking 4 tablets each day 1 tablet in the morning,1 tablet in the afternoon, and 2 tablets at bed 01/03/17   Lawyer, Harrell Gave, PA-C  HYDROcodone-acetaminophen (NORCO) 5-325 MG tablet Take 1 tablet by mouth every 4 (four) hours as needed. 08/28/20   Sherwood Gambler, MD  methocarbamol (ROBAXIN) 500 MG tablet Take 1 tablet (500 mg total) by mouth 2 (two) times daily. 03/06/16   Ashley Murrain, NP  ondansetron (ZOFRAN ODT) 8 MG disintegrating tablet Take 1 tablet (8 mg total) by mouth every 8 (eight) hours as needed for nausea or vomiting. 01/21/18   Dorie Rank, MD  oseltamivir (TAMIFLU) 75 MG capsule Take 1 capsule (75 mg total) by mouth 2 (two) times daily. 01/21/18   Dorie Rank, MD    Allergies    Shellfish allergy, Aspirin, and Latex  Review of Systems   Review of Systems  Constitutional:  Negative for chills and fever.  HENT:  Negative for ear pain and sore throat.   Eyes:  Negative for visual disturbance.  Respiratory:  Negative for cough and shortness of breath.   Cardiovascular:  Positive for chest pain. Negative for palpitations and leg swelling.  Gastrointestinal:  Positive for abdominal pain, diarrhea (improved), nausea (improved) and vomiting (improved). Anal  bleeding: improved. Genitourinary:  Negative for dysuria and hematuria.  Musculoskeletal:  Negative for back pain.  Skin:  Negative for color change and rash.  Neurological:  Negative for headaches.  All other systems reviewed and are negative.  Physical Exam Updated Vital Signs BP 128/84 (BP Location: Right Arm)   Pulse 88   Temp 98.9 F (37.2 C) (Oral)   Resp 16   Ht '5\' 3"'$  (1.6 m)   Wt 133.8 kg   LMP 09/10/2020   SpO2  100%   BMI 52.26 kg/m   Physical Exam Vitals and nursing note reviewed.  Constitutional:      General: She is not in acute distress.    Appearance: She is well-developed.  HENT:     Head: Normocephalic and atraumatic.  Eyes:     Conjunctiva/sclera: Conjunctivae normal.  Cardiovascular:     Rate and Rhythm: Normal rate and regular rhythm.     Heart sounds: No murmur heard. Pulmonary:     Effort: Pulmonary effort is normal. No respiratory distress.     Breath sounds: Normal breath sounds. No decreased breath sounds, wheezing, rhonchi or rales.  Chest:     Chest wall: Tenderness (TTP to the right chest wall) present.  Abdominal:     Palpations: Abdomen is soft.     Tenderness: There is no abdominal tenderness.  Musculoskeletal:     Cervical back: Neck supple.  Skin:    General: Skin is warm and dry.  Neurological:     Mental Status: She is alert.    ED Results / Procedures / Treatments   Labs (all labs ordered are listed, but only abnormal results are displayed) Labs Reviewed  CBC WITH DIFFERENTIAL/PLATELET  COMPREHENSIVE METABOLIC PANEL  LIPASE, BLOOD  URINALYSIS, ROUTINE W REFLEX MICROSCOPIC  PREGNANCY, URINE  TROPONIN I (HIGH SENSITIVITY)    EKG None  Radiology DG Chest 2 View  Result Date: 09/28/2020 CLINICAL DATA:  Chest pain. Additional history provided: Patient reports shoulder pain that radiates across to left side of chest. EXAM: CHEST - 2 VIEW COMPARISON:  None FINDINGS: Heart size within normal limits. No appreciable airspace  consolidation. No evidence of pleural effusion or pneumothorax. No acute bony abnormality identified. IMPRESSION: No evidence of active cardiopulmonary disease. Electronically Signed   By: Kellie Simmering D.O.   On: 09/28/2020 12:57    Procedures Procedures   Medications Ordered in ED Medications - No data to display  ED Course  I have reviewed the triage vital signs and the nursing notes.  Pertinent labs & imaging results that were available during my care of the patient were reviewed by me and considered in my medical decision making (see chart for details).    MDM Rules/Calculators/A&P                          35 year old female presents the emergency department today for evaluation of chest pain to the right side of the chest that started about a week ago.  Pain has been constant in nature.  Reviewed/interpreted labs CBC is without leukocytosis does show mild thrombocytosis but has been present on her prior labs CMP with mild hypokalemia, otherwise unremarkable Lipase is negative Initial troponin is negative, do not feel repeat Lance Bosch is necessary given pain has been constant and ongoing for several days UA negative Pregnancy test negative D-dimer negative, low suspicion for PE  EKG does not show any acute ischemic changes.  Chest x-ray reviewed/interpreted and does not show any evidence of cardiopulmonary disease.  Patient's work-up is unremarkable here in the emergency department.  She had some acid reflux in the department so was given a GI cocktail and symptoms improved.  She otherwise declined any pain medications and stated that she was fairly comfortable throughout her ED evaluation.  I have not found any emergent etiology of her symptoms at this time that would warrant further work-up or admission to the hospital.  I feel that she is appropriate for discharge home.  We will give her Rx for muscle relaxers as I suspect MSK cause of symptoms.  Have advised that she follow-up  with her PCP and return to ED for new or worsening symptoms.  She voiced understanding the plan and reasons to return.  All Questions answered.  Patient stable for discharge.  Final Clinical Impression(s) / ED Diagnoses Final diagnoses:  None    Rx / DC Orders ED Discharge Orders     None        Tivis Ringer, Blu Mcglaun S, PA-C [id number]    Campbell Stall P, DO [id number] [phone]

## 2020-09-28 NOTE — ED Notes (Signed)
Pt C/O gas pains. MD notified

## 2020-09-28 NOTE — Discharge Instructions (Addendum)
You were given a prescription for Robaxin which is a muscle relaxer.  You should not drive, work, or operate machinery while taking this medication as it can make you very drowsy.    Please follow up with your primary care provider within 5-7 days for re-evaluation of your symptoms. If you do not have a primary care provider, information for a healthcare clinic has been provided for you to make arrangements for follow up care. Please return to the emergency department for any new or worsening symptoms.

## 2020-09-28 NOTE — ED Triage Notes (Signed)
Lower abdominal pain earlier this week. She went to her MD and was treated for UTI and colitis. She was given Prednisone for pain in her upper chest on the right side of her chest radiating across her chest. Her symptoms are not getting better. EKG done at triage.

## 2020-10-08 DIAGNOSIS — N760 Acute vaginitis: Secondary | ICD-10-CM | POA: Diagnosis not present

## 2020-10-08 DIAGNOSIS — B9689 Other specified bacterial agents as the cause of diseases classified elsewhere: Secondary | ICD-10-CM | POA: Diagnosis not present

## 2020-10-08 DIAGNOSIS — B373 Candidiasis of vulva and vagina: Secondary | ICD-10-CM | POA: Diagnosis not present

## 2020-12-17 DIAGNOSIS — R519 Headache, unspecified: Secondary | ICD-10-CM | POA: Diagnosis not present

## 2020-12-17 DIAGNOSIS — R112 Nausea with vomiting, unspecified: Secondary | ICD-10-CM | POA: Diagnosis not present

## 2020-12-17 DIAGNOSIS — R051 Acute cough: Secondary | ICD-10-CM | POA: Diagnosis not present

## 2020-12-17 DIAGNOSIS — Z20828 Contact with and (suspected) exposure to other viral communicable diseases: Secondary | ICD-10-CM | POA: Diagnosis not present

## 2020-12-18 DIAGNOSIS — R051 Acute cough: Secondary | ICD-10-CM | POA: Diagnosis not present

## 2020-12-18 DIAGNOSIS — J029 Acute pharyngitis, unspecified: Secondary | ICD-10-CM | POA: Diagnosis not present

## 2020-12-18 DIAGNOSIS — R519 Headache, unspecified: Secondary | ICD-10-CM | POA: Diagnosis not present

## 2020-12-18 DIAGNOSIS — Z20828 Contact with and (suspected) exposure to other viral communicable diseases: Secondary | ICD-10-CM | POA: Diagnosis not present

## 2020-12-18 DIAGNOSIS — R509 Fever, unspecified: Secondary | ICD-10-CM | POA: Diagnosis not present

## 2020-12-18 DIAGNOSIS — M791 Myalgia, unspecified site: Secondary | ICD-10-CM | POA: Diagnosis not present

## 2021-02-11 ENCOUNTER — Other Ambulatory Visit: Payer: Self-pay

## 2021-02-11 ENCOUNTER — Emergency Department (HOSPITAL_BASED_OUTPATIENT_CLINIC_OR_DEPARTMENT_OTHER)
Admission: EM | Admit: 2021-02-11 | Discharge: 2021-02-11 | Disposition: A | Discharge status: Home / Self Care | Payer: Federal, State, Local not specified - PPO | Attending: Emergency Medicine | Admitting: Emergency Medicine

## 2021-02-11 ENCOUNTER — Emergency Department (HOSPITAL_BASED_OUTPATIENT_CLINIC_OR_DEPARTMENT_OTHER): Payer: Federal, State, Local not specified - PPO

## 2021-02-11 ENCOUNTER — Encounter (HOSPITAL_BASED_OUTPATIENT_CLINIC_OR_DEPARTMENT_OTHER): Payer: Self-pay

## 2021-02-11 DIAGNOSIS — M79605 Pain in left leg: Secondary | ICD-10-CM | POA: Diagnosis not present

## 2021-02-11 DIAGNOSIS — R0789 Other chest pain: Secondary | ICD-10-CM | POA: Insufficient documentation

## 2021-02-11 DIAGNOSIS — R079 Chest pain, unspecified: Secondary | ICD-10-CM | POA: Diagnosis not present

## 2021-02-11 DIAGNOSIS — R252 Cramp and spasm: Secondary | ICD-10-CM | POA: Diagnosis not present

## 2021-02-11 DIAGNOSIS — Z79899 Other long term (current) drug therapy: Secondary | ICD-10-CM | POA: Diagnosis not present

## 2021-02-11 LAB — CBC WITH DIFFERENTIAL/PLATELET
Abs Immature Granulocytes: 0.02 10*3/uL (ref 0.00–0.07)
Basophils Absolute: 0 10*3/uL (ref 0.0–0.1)
Basophils Relative: 1 %
Eosinophils Absolute: 0 10*3/uL (ref 0.0–0.5)
Eosinophils Relative: 1 %
HCT: 36.3 % (ref 36.0–46.0)
Hemoglobin: 13 g/dL (ref 12.0–15.0)
Immature Granulocytes: 0 %
Lymphocytes Relative: 30 %
Lymphs Abs: 2 10*3/uL (ref 0.7–4.0)
MCH: 28.1 pg (ref 26.0–34.0)
MCHC: 35.8 g/dL (ref 30.0–36.0)
MCV: 78.4 fL — ABNORMAL LOW (ref 80.0–100.0)
Monocytes Absolute: 0.5 10*3/uL (ref 0.1–1.0)
Monocytes Relative: 7 %
Neutro Abs: 4 10*3/uL (ref 1.7–7.7)
Neutrophils Relative %: 61 %
Platelets: 324 10*3/uL (ref 150–400)
RBC: 4.63 MIL/uL (ref 3.87–5.11)
RDW: 14 % (ref 11.5–15.5)
WBC: 6.6 10*3/uL (ref 4.0–10.5)
nRBC: 0 % (ref 0.0–0.2)

## 2021-02-11 LAB — HCG, SERUM, QUALITATIVE: Preg, Serum: NEGATIVE

## 2021-02-11 LAB — BASIC METABOLIC PANEL
Anion gap: 7 (ref 5–15)
BUN: 9 mg/dL (ref 6–20)
CO2: 24 mmol/L (ref 22–32)
Calcium: 8.9 mg/dL (ref 8.9–10.3)
Chloride: 103 mmol/L (ref 98–111)
Creatinine, Ser: 0.77 mg/dL (ref 0.44–1.00)
GFR, Estimated: >60 mL/min (ref >60)
Glucose, Bld: 100 mg/dL — ABNORMAL HIGH (ref 70–99)
Potassium: 3.9 mmol/L (ref 3.5–5.1)
Sodium: 134 mmol/L — ABNORMAL LOW (ref 135–145)

## 2021-02-11 LAB — TROPONIN I (HIGH SENSITIVITY): Troponin I (High Sensitivity): <2 ng/L (ref <18)

## 2021-02-11 MED ORDER — IBUPROFEN 800 MG PO TABS
800.0000 mg | ORAL_TABLET | Freq: Once | ORAL | Status: AC
  Administered 2021-02-11: 800 mg via ORAL
  Filled 2021-02-11: qty 1

## 2021-02-11 NOTE — ED Provider Notes (Signed)
[facility] EMERGENCY DEPARTMENT Provider Note   CSN: [id number] Arrival date & time: 02/11/21  1632     History  Chief Complaint  Patient presents with   Leg Pain    Gloria Yu is a 36 y.o. female.  Presents emergency room with concern for leg pain.  Patient states that since yesterday she is having some pain and cramping in her left calf.  Denies any swelling, redness, injuries.  Pain is relatively constant, no alleviating or aggravating factors identified.  On review of systems patient also endorsed chest pain, has intermittent episodes of chest pain/tightness sensation.  None today but did have some yesterday.  No difficulty in breathing.    HPI     Home Medications Prior to Admission medications   Medication Sig Start Date End Date Taking? Authorizing Provider  acetaminophen (TYLENOL) 500 MG tablet Take 1,000 mg by mouth every 6 (six) hours as needed.    [provider]  cetirizine (ZYRTEC) 10 MG tablet Take 1 tablet (10 mg total) by mouth daily. 10/29/15   Frederica Kuster, PA-C  diphenhydrAMINE (BENADRYL) 25 MG tablet Take 25 mg by mouth as needed.    [provider]  Doxylamine-Pyridoxine 10-10 MG TBEC Day 1- Take 2 tablets, by mouth at bedtime.Day 2- Take 2 tablets at bedtime. If your nausea and vomiting is better or controlled on Day2, continue to take 2 tablets every night at bedtime. Day 3- If you still had nausea and vomiting on Day 2, take 3 tablets on Day 3 1 tablet in the morning and 2 tablets at bedtime. nausea and vomiting on Day 3, start taking 4 tablets each day 1 tablet in the morning,1 tablet in the afternoon, and 2 tablets at bed 01/03/17   Lawyer, Harrell Gave, PA-C  HYDROcodone-acetaminophen (NORCO) 5-325 MG tablet Take 1 tablet by mouth every 4 (four) hours as needed. 08/28/20   Sherwood Gambler, MD  methocarbamol (ROBAXIN) 500 MG tablet Take 1 tablet (500 mg total) by mouth 2 (two) times daily. 03/06/16   Ashley Murrain, NP   methocarbamol (ROBAXIN) 500 MG tablet Take 1 tablet (500 mg total) by mouth 2 (two) times daily. 09/28/20   Couture, Cortni S, PA-C  ondansetron (ZOFRAN ODT) 8 MG disintegrating tablet Take 1 tablet (8 mg total) by mouth every 8 (eight) hours as needed for nausea or vomiting. 01/21/18   Dorie Rank, MD  oseltamivir (TAMIFLU) 75 MG capsule Take 1 capsule (75 mg total) by mouth 2 (two) times daily. 01/21/18   Dorie Rank, MD      Allergies    Shellfish allergy and Latex    Review of Systems   Review of Systems  Respiratory:  Positive for chest tightness.   Cardiovascular:  Positive for chest pain.  Musculoskeletal:  Positive for arthralgias.  All other systems reviewed and are negative.  Physical Exam Updated Vital Signs BP 132/84 (BP Location: Left Arm)    Pulse 99    Temp 98.6 F (37 C) (Oral)    Resp 18    Ht 5\' 3"  (1.6 m)    Wt 130.2 kg    LMP 02/11/2021    SpO2 100%    BMI 50.84 kg/m  Physical Exam Vitals and nursing note reviewed.  Constitutional:      General: She is not in acute distress.    Appearance: She is well-developed.  HENT:     Head: Normocephalic and atraumatic.  Eyes:     Conjunctiva/sclera: Conjunctivae normal.  Cardiovascular:  Rate and Rhythm: Normal rate and regular rhythm.     Heart sounds: No murmur heard. Pulmonary:     Effort: Pulmonary effort is normal. No respiratory distress.     Breath sounds: Normal breath sounds.  Abdominal:     Palpations: Abdomen is soft.     Tenderness: There is no abdominal tenderness.  Musculoskeletal:        General: No swelling.     Cervical back: Neck supple.     Comments: Mild tenderness noted to the left calf, no deformity appreciated, DP/PT pulses intact, sensation to light touch intact  Skin:    General: Skin is warm and dry.     Capillary Refill: Capillary refill takes less than 2 seconds.  Neurological:     General: No focal deficit present.     Mental Status: She is alert.  Psychiatric:        Mood and  Affect: Mood normal.    ED Results / Procedures / Treatments   Labs (all labs ordered are listed, but only abnormal results are displayed) Labs Reviewed  CBC WITH DIFFERENTIAL/PLATELET - Abnormal; Notable for the following components:      Result Value   MCV 78.4 (*)    All other components within normal limits  BASIC METABOLIC PANEL - Abnormal; Notable for the following components:   Sodium 134 (*)    Glucose, Bld 100 (*)    All other components within normal limits  HCG, SERUM, QUALITATIVE  TROPONIN I (HIGH SENSITIVITY)    EKG EKG Interpretation  Date/Time:  Monday February 11 2021 17:21:15 EST Ventricular Rate:  82 PR Interval:  148 QRS Duration: 91 QT Interval:  364 QTC Calculation: 426 R Axis:   0 Text Interpretation: Sinus rhythm Low voltage, precordial leads Borderline T abnormalities, anterior leads Confirmed by Madalyn Rob [phone]) on 02/11/2021 6:30:19 PM  Radiology DG Chest 2 View  Result Date: 02/11/2021 CLINICAL DATA:  Chest pain EXAM: CHEST - 2 VIEW COMPARISON:  September 28, 2020 FINDINGS: The heart size and mediastinal contours are within normal limits. Both lungs are clear. The visualized skeletal structures are unremarkable. IMPRESSION: No active cardiopulmonary disease. Electronically Signed   By: Abelardo Diesel M.D.   On: 02/11/2021 17:16   US Venous Img Lower Unilateral Left  Result Date: 02/11/2021 CLINICAL DATA:  Left leg cramping EXAM: Left LOWER EXTREMITY VENOUS DOPPLER ULTRASOUND TECHNIQUE: Gray-scale sonography with compression, as well as color and duplex ultrasound, were performed to evaluate the deep venous system(s) from the level of the common femoral vein through the popliteal and proximal calf veins. COMPARISON:  09/13/2019 FINDINGS: VENOUS Normal compressibility of the common femoral, superficial femoral, and popliteal veins, as well as the visualized calf veins. Visualized portions of profunda femoral vein and great saphenous vein  unremarkable. No filling defects to suggest DVT on grayscale or color Doppler imaging. Doppler waveforms show normal direction of venous flow, normal respiratory plasticity and response to augmentation. Limited views of the contralateral common femoral vein are unremarkable. OTHER None. Limitations: none IMPRESSION: Negative. Electronically Signed   By: Donavan Foil M.D.   On: 02/11/2021 18:22    Procedures Procedures    Medications Ordered in ED Medications  ibuprofen (ADVIL) tablet 800 mg (800 mg Oral Given 02/11/21 1731)    ED Course/ Medical Decision Making/ A&P                           Medical Decision Making  36 year old lady presents to ER with chief complaint of left calf pain.  On review of systems she did endorse occasional episodes of chest discomfort though she is not having any chest pain at present.  Obtain basic labs, EKG and chest x-ray and DVT study of her left leg.  DVT study negative.  Basic labs stable, no anemia.  EKG without acute ischemic change, troponin is undetectable, doubt ACS.  No tachypnea or hypoxia negative DVT study, and no ongoing difficulty breathing or chest discomfort, very low suspicion for PE at present and do not feel patient needs further lab or imaging testing for this.  Reassessed patient she denies ongoing complaint at present, discussed results with patient and her partner at bedside.  Will discharge home.  After the discussed management above, the patient was determined to be safe for discharge.  The patient was in agreement with this plan and all questions regarding their care were answered.  ED return precautions were discussed and the patient will return to the ED with any significant worsening of condition.         Final Clinical Impression(s) / ED Diagnoses Final diagnoses:  Left leg pain  Chest pain, unspecified type    Rx / DC Orders ED Discharge Orders     None         Lucrezia Starch, MD 02/11/21 [phone]

## 2021-02-11 NOTE — Discharge Instructions (Signed)
Follow-up with your primary care doctor.  If you develop worsening chest pain, difficulty breathing or other new concerning symptom, come back to ER for reassessment.

## 2021-02-11 NOTE — ED Triage Notes (Signed)
Pt c/o pain/cramping to left calf started yesterday-denies injury-NAD-steady gait

## 2021-02-11 NOTE — ED Notes (Signed)
IV infiltrated, bloodwork obtained prior to infiltration

## 2021-03-28 DIAGNOSIS — J069 Acute upper respiratory infection, unspecified: Secondary | ICD-10-CM | POA: Diagnosis not present

## 2021-03-28 DIAGNOSIS — Z20828 Contact with and (suspected) exposure to other viral communicable diseases: Secondary | ICD-10-CM | POA: Diagnosis not present

## 2021-03-28 DIAGNOSIS — J029 Acute pharyngitis, unspecified: Secondary | ICD-10-CM | POA: Diagnosis not present

## 2021-03-28 DIAGNOSIS — J309 Allergic rhinitis, unspecified: Secondary | ICD-10-CM | POA: Diagnosis not present

## 2021-03-31 ENCOUNTER — Emergency Department (HOSPITAL_BASED_OUTPATIENT_CLINIC_OR_DEPARTMENT_OTHER)
Admission: EM | Admit: 2021-03-31 | Discharge: 2021-03-31 | Disposition: A | Discharge status: Home / Self Care | Payer: Federal, State, Local not specified - PPO | Attending: Emergency Medicine | Admitting: Emergency Medicine

## 2021-03-31 ENCOUNTER — Encounter (HOSPITAL_BASED_OUTPATIENT_CLINIC_OR_DEPARTMENT_OTHER): Payer: Self-pay | Admitting: Emergency Medicine

## 2021-03-31 ENCOUNTER — Other Ambulatory Visit: Payer: Self-pay

## 2021-03-31 DIAGNOSIS — I10 Essential (primary) hypertension: Secondary | ICD-10-CM | POA: Diagnosis not present

## 2021-03-31 DIAGNOSIS — Z91040 Latex allergy status: Secondary | ICD-10-CM | POA: Insufficient documentation

## 2021-03-31 DIAGNOSIS — Z20822 Contact with and (suspected) exposure to covid-19: Secondary | ICD-10-CM | POA: Insufficient documentation

## 2021-03-31 DIAGNOSIS — J069 Acute upper respiratory infection, unspecified: Secondary | ICD-10-CM | POA: Diagnosis not present

## 2021-03-31 DIAGNOSIS — J029 Acute pharyngitis, unspecified: Secondary | ICD-10-CM | POA: Diagnosis not present

## 2021-03-31 DIAGNOSIS — B9789 Other viral agents as the cause of diseases classified elsewhere: Secondary | ICD-10-CM | POA: Diagnosis not present

## 2021-03-31 LAB — RESP PANEL BY RT-PCR (FLU A&B, COVID) ARPGX2
Influenza A by PCR: NEGATIVE
Influenza B by PCR: NEGATIVE
SARS Coronavirus 2 by RT PCR: NEGATIVE

## 2021-03-31 MED ORDER — BENZONATATE 100 MG PO CAPS: 100.0000 mg | ORAL_CAPSULE | Freq: Three times a day (TID) | ORAL | 0 refills | Status: DC | PRN

## 2021-03-31 NOTE — ED Notes (Signed)
ED Provider at bedside. 

## 2021-03-31 NOTE — Discharge Instructions (Signed)
If you develop high fever, severe cough or cough with blood, trouble breathing, severe headache, neck pain/stiffness, vomiting, or any other new/concerning symptoms then return to the ER for evaluation  ? ?Your COVID and influenza results should result in the next couple hours and you can view these on your MyChart.  You can set up your MyChart from the paperwork given today.  At worst you can call here for results in a couple hours. ?
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
## 2021-04-02 DIAGNOSIS — J329 Chronic sinusitis, unspecified: Secondary | ICD-10-CM | POA: Diagnosis not present

## 2021-04-15 DIAGNOSIS — Z131 Encounter for screening for diabetes mellitus: Secondary | ICD-10-CM | POA: Diagnosis not present

## 2021-04-15 DIAGNOSIS — N926 Irregular menstruation, unspecified: Secondary | ICD-10-CM | POA: Diagnosis not present

## 2021-04-15 DIAGNOSIS — Z111 Encounter for screening for respiratory tuberculosis: Secondary | ICD-10-CM | POA: Diagnosis not present

## 2021-04-15 DIAGNOSIS — Z13220 Encounter for screening for lipoid disorders: Secondary | ICD-10-CM | POA: Diagnosis not present

## 2021-04-15 DIAGNOSIS — Z0000 Encounter for general adult medical examination without abnormal findings: Secondary | ICD-10-CM | POA: Diagnosis not present

## 2021-05-10 DIAGNOSIS — N898 Other specified noninflammatory disorders of vagina: Secondary | ICD-10-CM | POA: Diagnosis not present

## 2021-05-10 DIAGNOSIS — Z1331 Encounter for screening for depression: Secondary | ICD-10-CM | POA: Diagnosis not present

## 2021-05-10 DIAGNOSIS — R300 Dysuria: Secondary | ICD-10-CM | POA: Diagnosis not present

## 2021-06-02 DIAGNOSIS — R002 Palpitations: Secondary | ICD-10-CM | POA: Diagnosis not present

## 2021-06-02 DIAGNOSIS — R079 Chest pain, unspecified: Secondary | ICD-10-CM | POA: Diagnosis not present

## 2021-06-03 DIAGNOSIS — I498 Other specified cardiac arrhythmias: Secondary | ICD-10-CM | POA: Diagnosis not present

## 2021-06-03 DIAGNOSIS — R079 Chest pain, unspecified: Secondary | ICD-10-CM | POA: Diagnosis not present

## 2021-06-03 DIAGNOSIS — R002 Palpitations: Secondary | ICD-10-CM | POA: Diagnosis not present

## 2021-06-04 DIAGNOSIS — R002 Palpitations: Secondary | ICD-10-CM | POA: Diagnosis not present

## 2021-06-04 DIAGNOSIS — Z6841 Body Mass Index (BMI) 40.0 and over, adult: Secondary | ICD-10-CM | POA: Diagnosis not present

## 2021-07-31 ENCOUNTER — Encounter (HOSPITAL_COMMUNITY): Payer: Self-pay | Admitting: Emergency Medicine

## 2021-07-31 ENCOUNTER — Other Ambulatory Visit: Payer: Self-pay

## 2021-07-31 ENCOUNTER — Emergency Department (HOSPITAL_COMMUNITY): Payer: Federal, State, Local not specified - PPO

## 2021-07-31 ENCOUNTER — Emergency Department (HOSPITAL_COMMUNITY)
Admission: EM | Admit: 2021-07-31 | Discharge: 2021-07-31 | Disposition: A | Discharge status: Home / Self Care | Payer: Federal, State, Local not specified - PPO | Attending: Emergency Medicine | Admitting: Emergency Medicine

## 2021-07-31 DIAGNOSIS — R079 Chest pain, unspecified: Secondary | ICD-10-CM | POA: Insufficient documentation

## 2021-07-31 DIAGNOSIS — R0789 Other chest pain: Secondary | ICD-10-CM | POA: Diagnosis not present

## 2021-07-31 DIAGNOSIS — Z91040 Latex allergy status: Secondary | ICD-10-CM | POA: Insufficient documentation

## 2021-07-31 DIAGNOSIS — M25512 Pain in left shoulder: Secondary | ICD-10-CM | POA: Insufficient documentation

## 2021-07-31 DIAGNOSIS — Y92410 Unspecified street and highway as the place of occurrence of the external cause: Secondary | ICD-10-CM | POA: Diagnosis not present

## 2021-07-31 DIAGNOSIS — M542 Cervicalgia: Secondary | ICD-10-CM | POA: Insufficient documentation

## 2021-07-31 DIAGNOSIS — M79602 Pain in left arm: Secondary | ICD-10-CM | POA: Insufficient documentation

## 2021-07-31 DIAGNOSIS — Z041 Encounter for examination and observation following transport accident: Secondary | ICD-10-CM | POA: Diagnosis not present

## 2021-07-31 MED ORDER — METHOCARBAMOL 500 MG PO TABS: 500.0000 mg | ORAL_TABLET | Freq: Three times a day (TID) | ORAL | 0 refills | Status: DC | PRN

## 2021-07-31 MED ORDER — NAPROXEN 500 MG PO TABS: 500.0000 mg | ORAL_TABLET | Freq: Two times a day (BID) | ORAL | 0 refills | Status: DC | PRN

## 2021-07-31 MED ORDER — METHOCARBAMOL 500 MG PO TABS
500.0000 mg | ORAL_TABLET | Freq: Three times a day (TID) | ORAL | 0 refills | Status: DC | PRN
Start: 2021-07-31 — End: 2022-02-26
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
## 2021-08-07 DIAGNOSIS — R9431 Abnormal electrocardiogram [ECG] [EKG]: Secondary | ICD-10-CM | POA: Diagnosis not present

## 2021-08-07 DIAGNOSIS — R0789 Other chest pain: Secondary | ICD-10-CM | POA: Diagnosis not present

## 2021-08-07 DIAGNOSIS — R079 Chest pain, unspecified: Secondary | ICD-10-CM | POA: Diagnosis not present

## 2021-10-14 DIAGNOSIS — R0981 Nasal congestion: Secondary | ICD-10-CM | POA: Diagnosis not present

## 2021-10-14 DIAGNOSIS — J040 Acute laryngitis: Secondary | ICD-10-CM | POA: Diagnosis not present

## 2021-10-14 DIAGNOSIS — R519 Headache, unspecified: Secondary | ICD-10-CM | POA: Diagnosis not present

## 2021-10-31 DIAGNOSIS — R0981 Nasal congestion: Secondary | ICD-10-CM | POA: Diagnosis not present

## 2021-10-31 DIAGNOSIS — R509 Fever, unspecified: Secondary | ICD-10-CM | POA: Diagnosis not present

## 2021-12-02 ENCOUNTER — Telehealth: Payer: Self-pay

## 2021-12-02 NOTE — Patient Outreach (Signed)
  Care Coordination   12/02/2021 Name: Donya Hitch MRN: [id number] DOB: 1985/02/25   Care Coordination Outreach Attempts:  An unsuccessful telephone outreach was attempted today to offer the patient information about available care coordination services as a benefit of their health plan.   Follow Up Plan:  Additional outreach attempts will be made to offer the patient care coordination information and services.   Encounter Outcome:  No Answer  Care Coordination Interventions Activated:  No   Care Coordination Interventions:  No, not indicated    Tomasa Rand, RN, BSN, CEN [facility] [employer] [phone]

## 2021-12-25 ENCOUNTER — Telehealth: Payer: Self-pay

## 2021-12-25 NOTE — Patient Outreach (Signed)
  Care Coordination   Initial Visit Note   12/25/2021 Name: Theta Leaf MRN: [id number] DOB: [birth year]  Ar[place]a Freshour is a 36 y.o. year old female who sees Ronita Hipps, MD for primary care. I spoke with  Johnsie Cancel by phone today.  What matters to the patients health and wellness today?  Placed call to patient who answered, explained the purpose of my call and patient hung up.    SDOH assessments and interventions completed:  No     Care Coordination Interventions:  No, not indicated   Follow up plan: No further intervention required.   Encounter Outcome:  Pt. Refused   Tomasa Rand, RN, BSN, CEN [facility] [employer] [phone]

## 2022-02-19 DIAGNOSIS — J029 Acute pharyngitis, unspecified: Secondary | ICD-10-CM | POA: Diagnosis not present

## 2022-02-19 DIAGNOSIS — J040 Acute laryngitis: Secondary | ICD-10-CM | POA: Diagnosis not present

## 2022-02-26 ENCOUNTER — Emergency Department (HOSPITAL_BASED_OUTPATIENT_CLINIC_OR_DEPARTMENT_OTHER)
Admission: EM | Admit: 2022-02-26 | Discharge: 2022-02-26 | Disposition: A | Discharge status: Home / Self Care | Payer: PRIVATE HEALTH INSURANCE | Attending: Emergency Medicine | Admitting: Emergency Medicine

## 2022-02-26 ENCOUNTER — Encounter (HOSPITAL_BASED_OUTPATIENT_CLINIC_OR_DEPARTMENT_OTHER): Payer: Self-pay | Admitting: Emergency Medicine

## 2022-02-26 ENCOUNTER — Other Ambulatory Visit: Payer: Self-pay

## 2022-02-26 ENCOUNTER — Emergency Department (HOSPITAL_BASED_OUTPATIENT_CLINIC_OR_DEPARTMENT_OTHER): Payer: PRIVATE HEALTH INSURANCE

## 2022-02-26 DIAGNOSIS — S46912A Strain of unspecified muscle, fascia and tendon at shoulder and upper arm level, left arm, initial encounter: Secondary | ICD-10-CM | POA: Diagnosis not present

## 2022-02-26 DIAGNOSIS — Y92410 Unspecified street and highway as the place of occurrence of the external cause: Secondary | ICD-10-CM | POA: Insufficient documentation

## 2022-02-26 DIAGNOSIS — M25512 Pain in left shoulder: Secondary | ICD-10-CM | POA: Diagnosis not present

## 2022-02-26 DIAGNOSIS — S4992XA Unspecified injury of left shoulder and upper arm, initial encounter: Secondary | ICD-10-CM | POA: Diagnosis not present

## 2022-02-26 MED ORDER — METHOCARBAMOL 500 MG PO TABS: 500.0000 mg | ORAL_TABLET | Freq: Three times a day (TID) | ORAL | 0 refills | Status: DC | PRN

## 2022-02-26 MED ORDER — DICLOFENAC SODIUM 1 % EX GEL: 2.0000 g | Freq: Four times a day (QID) | CUTANEOUS | 0 refills | Status: DC | PRN

## 2022-02-26 NOTE — Discharge Instructions (Signed)

## 2022-02-26 NOTE — ED Notes (Signed)
D/c paperwork reviewed with pt, including prescriptions and follow up care.  All questions and/or concerns addressed at time of d/c.  No further needs expressed. . Pt verbalized understanding, Ambulatory with family to ED exit, NAD.   
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
## 2022-02-28 DIAGNOSIS — R197 Diarrhea, unspecified: Secondary | ICD-10-CM | POA: Diagnosis not present

## 2022-02-28 DIAGNOSIS — R112 Nausea with vomiting, unspecified: Secondary | ICD-10-CM | POA: Diagnosis not present

## 2022-02-28 DIAGNOSIS — Z3202 Encounter for pregnancy test, result negative: Secondary | ICD-10-CM | POA: Diagnosis not present

## 2022-03-14 DIAGNOSIS — M79672 Pain in left foot: Secondary | ICD-10-CM | POA: Diagnosis not present

## 2022-05-09 DIAGNOSIS — Z6841 Body Mass Index (BMI) 40.0 and over, adult: Secondary | ICD-10-CM | POA: Diagnosis not present

## 2022-05-09 DIAGNOSIS — F410 Panic disorder [episodic paroxysmal anxiety] without agoraphobia: Secondary | ICD-10-CM | POA: Diagnosis not present

## 2022-05-30 DIAGNOSIS — M25561 Pain in right knee: Secondary | ICD-10-CM | POA: Diagnosis not present

## 2022-05-30 DIAGNOSIS — M7051 Other bursitis of knee, right knee: Secondary | ICD-10-CM | POA: Diagnosis not present

## 2022-05-30 DIAGNOSIS — M170 Bilateral primary osteoarthritis of knee: Secondary | ICD-10-CM | POA: Diagnosis not present

## 2022-05-30 DIAGNOSIS — M7052 Other bursitis of knee, left knee: Secondary | ICD-10-CM | POA: Diagnosis not present

## 2022-06-10 DIAGNOSIS — R070 Pain in throat: Secondary | ICD-10-CM | POA: Diagnosis not present

## 2022-06-10 DIAGNOSIS — H6692 Otitis media, unspecified, left ear: Secondary | ICD-10-CM | POA: Diagnosis not present

## 2022-06-10 DIAGNOSIS — R0981 Nasal congestion: Secondary | ICD-10-CM | POA: Diagnosis not present

## 2022-06-10 DIAGNOSIS — R051 Acute cough: Secondary | ICD-10-CM | POA: Diagnosis not present

## 2022-08-18 DIAGNOSIS — N926 Irregular menstruation, unspecified: Secondary | ICD-10-CM | POA: Diagnosis not present

## 2022-08-18 DIAGNOSIS — Z975 Presence of (intrauterine) contraceptive device: Secondary | ICD-10-CM | POA: Diagnosis not present

## 2022-08-18 DIAGNOSIS — D251 Intramural leiomyoma of uterus: Secondary | ICD-10-CM | POA: Diagnosis not present

## 2022-08-18 DIAGNOSIS — N920 Excessive and frequent menstruation with regular cycle: Secondary | ICD-10-CM | POA: Diagnosis not present

## 2022-09-02 DIAGNOSIS — M25561 Pain in right knee: Secondary | ICD-10-CM | POA: Diagnosis not present

## 2022-09-02 DIAGNOSIS — Z1329 Encounter for screening for other suspected endocrine disorder: Secondary | ICD-10-CM | POA: Diagnosis not present

## 2022-09-02 DIAGNOSIS — G8929 Other chronic pain: Secondary | ICD-10-CM | POA: Diagnosis not present

## 2022-09-02 DIAGNOSIS — Z1321 Encounter for screening for nutritional disorder: Secondary | ICD-10-CM | POA: Diagnosis not present

## 2022-09-02 DIAGNOSIS — Z0000 Encounter for general adult medical examination without abnormal findings: Secondary | ICD-10-CM | POA: Diagnosis not present

## 2022-09-02 DIAGNOSIS — F419 Anxiety disorder, unspecified: Secondary | ICD-10-CM | POA: Diagnosis not present

## 2022-09-02 DIAGNOSIS — Z131 Encounter for screening for diabetes mellitus: Secondary | ICD-10-CM | POA: Diagnosis not present

## 2022-09-02 DIAGNOSIS — Z13220 Encounter for screening for lipoid disorders: Secondary | ICD-10-CM | POA: Diagnosis not present

## 2022-09-05 DIAGNOSIS — R110 Nausea: Secondary | ICD-10-CM | POA: Diagnosis not present

## 2022-09-05 DIAGNOSIS — R519 Headache, unspecified: Secondary | ICD-10-CM | POA: Diagnosis not present

## 2022-09-05 DIAGNOSIS — Z20822 Contact with and (suspected) exposure to covid-19: Secondary | ICD-10-CM | POA: Diagnosis not present

## 2022-09-05 DIAGNOSIS — R1030 Lower abdominal pain, unspecified: Secondary | ICD-10-CM | POA: Diagnosis not present

## 2022-09-23 DIAGNOSIS — X58XXXA Exposure to other specified factors, initial encounter: Secondary | ICD-10-CM | POA: Diagnosis not present

## 2022-09-23 DIAGNOSIS — Z20822 Contact with and (suspected) exposure to covid-19: Secondary | ICD-10-CM | POA: Diagnosis not present

## 2022-09-23 DIAGNOSIS — Y990 Civilian activity done for income or pay: Secondary | ICD-10-CM | POA: Diagnosis not present

## 2022-09-23 DIAGNOSIS — S29011A Strain of muscle and tendon of front wall of thorax, initial encounter: Secondary | ICD-10-CM | POA: Diagnosis not present

## 2022-09-23 DIAGNOSIS — Y92210 Daycare center as the place of occurrence of the external cause: Secondary | ICD-10-CM | POA: Diagnosis not present

## 2022-09-23 DIAGNOSIS — R079 Chest pain, unspecified: Secondary | ICD-10-CM | POA: Diagnosis not present

## 2022-09-24 DIAGNOSIS — R079 Chest pain, unspecified: Secondary | ICD-10-CM | POA: Diagnosis not present

## 2022-09-26 DIAGNOSIS — M25512 Pain in left shoulder: Secondary | ICD-10-CM | POA: Diagnosis not present

## 2022-09-26 DIAGNOSIS — F419 Anxiety disorder, unspecified: Secondary | ICD-10-CM | POA: Diagnosis not present

## 2022-12-28 ENCOUNTER — Emergency Department (HOSPITAL_BASED_OUTPATIENT_CLINIC_OR_DEPARTMENT_OTHER)
Admission: EM | Admit: 2022-12-28 | Discharge: 2022-12-28 | Disposition: A | Discharge status: Home / Self Care | Payer: Federal, State, Local not specified - PPO | Attending: Emergency Medicine | Admitting: Emergency Medicine

## 2022-12-28 ENCOUNTER — Encounter (HOSPITAL_BASED_OUTPATIENT_CLINIC_OR_DEPARTMENT_OTHER): Payer: Self-pay

## 2022-12-28 ENCOUNTER — Other Ambulatory Visit: Payer: Self-pay

## 2022-12-28 ENCOUNTER — Emergency Department (HOSPITAL_BASED_OUTPATIENT_CLINIC_OR_DEPARTMENT_OTHER): Payer: Federal, State, Local not specified - PPO

## 2022-12-28 DIAGNOSIS — R079 Chest pain, unspecified: Secondary | ICD-10-CM | POA: Diagnosis not present

## 2022-12-28 DIAGNOSIS — Z91040 Latex allergy status: Secondary | ICD-10-CM | POA: Insufficient documentation

## 2022-12-28 DIAGNOSIS — R202 Paresthesia of skin: Secondary | ICD-10-CM | POA: Diagnosis not present

## 2022-12-28 DIAGNOSIS — R0789 Other chest pain: Secondary | ICD-10-CM | POA: Diagnosis not present

## 2022-12-28 LAB — I-STAT CHEM 8, ED
BUN: 10 mg/dL (ref 6–20)
Calcium, Ion: 1.18 mmol/L (ref 1.15–1.40)
Chloride: 106 mmol/L (ref 98–111)
Creatinine, Ser: 0.7 mg/dL (ref 0.44–1.00)
Glucose, Bld: 90 mg/dL (ref 70–99)
HCT: 38 % (ref 36.0–46.0)
Hemoglobin: 12.9 g/dL (ref 12.0–15.0)
Potassium: 3.8 mmol/L (ref 3.5–5.1)
Sodium: 140 mmol/L (ref 135–145)
TCO2: 21 mmol/L — ABNORMAL LOW (ref 22–32)

## 2022-12-28 LAB — CBC
HCT: 36.3 % (ref 36.0–46.0)
Hemoglobin: 13 g/dL (ref 12.0–15.0)
MCH: 28.1 pg (ref 26.0–34.0)
MCHC: 35.8 g/dL (ref 30.0–36.0)
MCV: 78.6 fL — ABNORMAL LOW (ref 80.0–100.0)
Platelets: 327 10*3/uL (ref 150–400)
RBC: 4.62 MIL/uL (ref 3.87–5.11)
RDW: 13.2 % (ref 11.5–15.5)
WBC: 5.4 10*3/uL (ref 4.0–10.5)
nRBC: 0 % (ref 0.0–0.2)

## 2022-12-28 LAB — TROPONIN I (HIGH SENSITIVITY): Troponin I (High Sensitivity): <2 ng/L (ref <18)

## 2022-12-28 LAB — HCG, SERUM, QUALITATIVE: Preg, Serum: NEGATIVE

## 2022-12-28 NOTE — Discharge Instructions (Signed)
If you develop recurrent, continued, or worsening chest pain, shortness of breath, fever, vomiting, abdominal or back pain, or any other new/concerning symptoms then return to the ER for evaluation.  

## 2022-12-28 NOTE — ED Triage Notes (Signed)
Pt states she has been having chest pain for the past year. States it goes to her bilateral arms. Pt states she has been seen for it and told it is anxiety.

## 2022-12-28 NOTE — ED Provider Notes (Signed)
[place] [facility] Provider Note   CSN: [id number] Arrival date & time: 12/28/22  4098     History  Chief Complaint  Patient presents with   Chest Pain    Gloria Yu is a 37 y.o. female.  HPI 37 year old female presents with chest pain.  She reports that she has had on and off chest pain for over a year.  This chest pain started last night and is in her left upper chest.  Is been ongoing since last night and feels uncomfortable.  Similar to prior episodes.  She is concerned because she has a family history of early coronary disease with her dad having a heart attack in his 64s.  However she denies any chronic medical problems including no smoking history, hypertension, diabetes, etc.  She also reports tingling in her hands.  This typically happens whenever she lays on her side, forcing her to lay on her back.  When it occurred last night when she was going to bed it was worse than typical.  The symptoms are resolved now.  No leg symptoms.  No leg swelling.  Home Medications Prior to Admission medications   Medication Sig Start Date End Date Taking? Authorizing Provider  acetaminophen (TYLENOL) 500 MG tablet Take 1,000 mg by mouth every 6 (six) hours as needed.    [provider]  benzonatate (TESSALON) 100 MG capsule Take 1 capsule (100 mg total) by mouth 3 (three) times daily as needed for cough. 03/31/21   Pricilla Loveless, MD  cetirizine (ZYRTEC) 10 MG tablet Take 1 tablet (10 mg total) by mouth daily. 10/29/15   Deborha Payment, PA-C  diclofenac Sodium (VOLTAREN) 1 % GEL Apply 2 g topically 4 (four) times daily as needed. 02/26/22   Long, Arlyss Repress, MD  diphenhydrAMINE (BENADRYL) 25 MG tablet Take 25 mg by mouth as needed.    [provider]  Doxylamine-Pyridoxine 10-10 MG TBEC Day 1- Take 2 tablets, by mouth at bedtime.Day 2- Take 2 tablets at bedtime. If your nausea and vomiting is better or controlled on Day2, continue to take 2  tablets every night at bedtime. Day 3- If you still had nausea and vomiting on Day 2, take 3 tablets on Day 3 1 tablet in the morning and 2 tablets at bedtime. nausea and vomiting on Day 3, start taking 4 tablets each day 1 tablet in the morning,1 tablet in the afternoon, and 2 tablets at bed 01/03/17   Lawyer, Cristal Deer, PA-C  HYDROcodone-acetaminophen (NORCO) 5-325 MG tablet Take 1 tablet by mouth every 4 (four) hours as needed. 08/28/20   Pricilla Loveless, MD  methocarbamol (ROBAXIN) 500 MG tablet Take 1 tablet (500 mg total) by mouth every 8 (eight) hours as needed for muscle spasms. 02/26/22   Long, Arlyss Repress, MD  naproxen (NAPROSYN) 500 MG tablet Take 1 tablet (500 mg total) by mouth 2 (two) times daily as needed for moderate pain. 07/31/21   Gloris Manchester, MD  ondansetron (ZOFRAN ODT) 8 MG disintegrating tablet Take 1 tablet (8 mg total) by mouth every 8 (eight) hours as needed for nausea or vomiting. 01/21/18   Linwood Dibbles, MD  oseltamivir (TAMIFLU) 75 MG capsule Take 1 capsule (75 mg total) by mouth 2 (two) times daily. 01/21/18   Linwood Dibbles, MD      Allergies    Shellfish allergy and Latex    Review of Systems   Review of Systems  Cardiovascular:  Positive for chest pain.  Gastrointestinal:  Negative for abdominal pain.  Musculoskeletal:  Negative for back pain.  Neurological:  Positive for numbness (tingling). Negative for weakness.    Physical Exam Updated Vital Signs BP 111/72 (BP Location: Right Arm)   Pulse 88   Temp 97.6 F (36.4 C) (Oral)   Resp 18   Wt 130.6 kg   SpO2 100%   BMI 50.22 kg/m  Physical Exam Vitals and nursing note reviewed.  Constitutional:      General: She is not in acute distress.    Appearance: She is well-developed. She is obese. She is not ill-appearing or diaphoretic.  HENT:     Head: Normocephalic and atraumatic.  Cardiovascular:     Rate and Rhythm: Normal rate and regular rhythm.     Pulses:          Radial pulses are 2+ on the right side and  2+ on the left side.     Heart sounds: Normal heart sounds.  Pulmonary:     Effort: Pulmonary effort is normal.     Breath sounds: Normal breath sounds.  Abdominal:     Palpations: Abdomen is soft.     Tenderness: There is no abdominal tenderness.  Musculoskeletal:     Right lower leg: No edema.     Left lower leg: No edema.  Skin:    General: Skin is warm and dry.  Neurological:     Mental Status: She is alert.     Comments: Grossly normal sensation in both hands.  5/5 strength in both upper extremities.     ED Results / Procedures / Treatments   Labs (all labs ordered are listed, but only abnormal results are displayed) Labs Reviewed  CBC - Abnormal; Notable for the following components:      Result Value   MCV 78.6 (*)    All other components within normal limits  I-STAT CHEM 8, ED - Abnormal; Notable for the following components:   TCO2 21 (*)    All other components within normal limits  HCG, SERUM, QUALITATIVE  TROPONIN I (HIGH SENSITIVITY)    EKG EKG Interpretation Date/Time:  Sunday December 28 2022 08:29:36 EST Ventricular Rate:  84 PR Interval:  133 QRS Duration:  96 QT Interval:  362 QTC Calculation: 428 R Axis:   3  Text Interpretation: Sinus rhythm Low voltage, precordial leads no significant change since 2023 Confirmed by Pricilla Loveless [phone]) on 12/28/2022 8:32:37 AM  Radiology DG Chest 2 View  Result Date: 12/28/2022 CLINICAL DATA:  Chest pain for the past year EXAM: CHEST - 2 VIEW COMPARISON:  09/23/2022 FINDINGS: Artifact from EKG leads. Normal heart size and mediastinal contours. No acute infiltrate or edema. No effusion or pneumothorax. No acute osseous findings. IMPRESSION: No active cardiopulmonary disease. Electronically Signed   By: Tiburcio Pea M.D.   On: 12/28/2022 09:58    Procedures Procedures    Medications Ordered in ED Medications - No data to display  ED Course/ Medical Decision Making/ A&P                                  Medical Decision Making Amount and/or Complexity of Data Reviewed External Data Reviewed: notes. Labs: ordered.    Details: Normal troponin and Radiology: ordered and independent interpretation performed.    Details: No CHF ECG/medicine tests: ordered and independent interpretation performed.    Details: No ischemia   Patient has had chest  pain since last night and given normal troponin and recurrent symptoms my suspicion for ACS is quite low.  I do not think further troponins will be helpful or necessary.  Low suspicion for PE, dissection, esophageal emergency, etc.  Vitals are unremarkable currently.  She appears stable for discharge to follow-up with her PCP.  Unclear what is causing the tingling in her hands but she has intact pulses and grossly normal neuroexam in her upper extremities.  Will discharge home to follow-up with PCP and give return precautions.        Final Clinical Impression(s) / ED Diagnoses Final diagnoses:  Nonspecific chest pain    Rx / DC Orders ED Discharge Orders     None         Pricilla Loveless, MD 12/28/22 1018

## 2023-02-03 ENCOUNTER — Other Ambulatory Visit (HOSPITAL_COMMUNITY): Payer: Self-pay

## 2023-03-18 NOTE — Progress Notes (Signed)
 Cardiology Office Note:    Date:  03/23/2023   ID:  Gloria Yu, DOB 1985/02/27, MRN [id number]  PCP:  Marylen Ponto, MD  Cardiologist:  Norman Herrlich, MD    Referring MD: Marylen Ponto, MD    ASSESSMENT:    1. Precordial pain   2. 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  Concerns regarding medicines are outlined above.  Orders Placed This Encounter  Procedures   EKG 12-Lead   ECHOCARDIOGRAM COMPLETE   Meds ordered this encounter  Medications   celecoxib (CELEBREX) 100 MG capsule    Sig: Take 1 capsule (100 mg total) by mouth 2 (two) times daily.    Dispense:  30 capsule    Refill:  2     History of Present Illness:    Gloria Yu is a 38 y.o. female here PCP for evaluation palpitation.  In the emergency department med [facility] 12/28/2022 for chest pain off-and-on for 1 year.  It was described as nonexertional and positional in nature.  Evaluation included a chest x-ray that was normal high-sensitivity troponin that was undetectable CBC was normal except for microcytic indices renal functions sodium and potassium were normal and her EKG was described as sinus rhythm low voltage otherwise normal.  Compliance with diet, lifestyle and medications: Yes  Her father is present with her participates in the evaluation decision making.  I do not well without heart transplantation I know her mother who has had  end-stage renal disease and renal transplantation. She has no history of heart disease congenital rheumatic or arrhythmia.  She had previous rapid heartbeat and wore a monitor in 2023 unremarkable Recently had an episode in church where her heart was quite rapid apparently a physician as well as a nurse and church fell her pulse told her was quite fast and to see a cardiologist.  She captured with her smart watch but it was after the event and it was sinus rhythm.  Previously worked in a daycare doing Equities trader.  She has developed chronic chest pain costochondral junctions bilaterally reproducible on physical exam as well as pain in both shoulders. Her symptoms are not exertional or relieved with rest not anginal in nature. She is concerned with her family history of she has underlying heart disease Not having edema shortness of breath or syncope Past Medical History:  Diagnosis Date   Acute medial meniscus tear of right knee 11/05/2016   Ankle contracture, left 12/10/2018   BMI 50.0-59.9, adult (HCC) 05/29/2017   Claustrophobia    Cyst of breast 09/26/2019   Formatting of this note might be different from the original.  Hx excision breast cyst at age 30 yrs     Fatigue    Headache    migraines   Hx of abnormal cervical Pap smear 09/26/2019   Irregular bleeding 02/17/2019   IUD (intrauterine device) in place 03/17/2019   Formatting of this note might be different from the original.  Mirena IUD inserted 03-08-19     Lump of breast, right    removed at age 89   Other specified postprocedural states 07/05/2015   Formatting of this note might be different from the original.  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 Repeat 1 yr  Formatting of this note might be different from the original.  07/24/2017 Cerclage  removed without diff   Palpitations    Plantar fasciitis of left foot 12/10/2018   Sickle cell trait (HCC)     Current Medications: Current Meds  Medication Sig   acetaminophen (TYLENOL) 500 MG tablet Take 1,000 mg by mouth every 6 (six) hours as needed for mild pain (pain score 1-3) or moderate pain (pain score 4-6).   buPROPion (WELLBUTRIN XL) 150 MG 24 hr tablet Take 150 mg by mouth daily.   celecoxib (CELEBREX) 100 MG capsule Take 1 capsule (100 mg total) by mouth 2 (two) times daily.   hydrOXYzine (ATARAX) 25 MG tablet Take 25 mg by mouth as needed for anxiety.      EKGs/Labs/Other Studies Reviewed:    The following studies were reviewed today:      EKG Interpretation Date/Time:  Monday March 23 2023 16:03:19 EST Ventricular Rate:  88 PR Interval:  132 QRS Duration:  78 QT Interval:  364 QTC Calculation: 440 R Axis:   9  Text Interpretation: Normal sinus rhythm Poor R wave progression Abnormal ECG When compared with ECG of 28-Dec-2022 08:29, No significant change was found Confirmed by Norman Herrlich ([id number]) on 03/23/2023 4:54:47 PM   Recent Labs: 12/28/2022: BUN 10; Creatinine, Ser 0.70; Hemoglobin 12.9; Platelets 327; Potassium 3.8; Sodium 140  Recent Lipid Panel No results found for: "CHOL", "TRIG", "HDL", "CHOLHDL", "VLDL", "LDLCALC", "LDLDIRECT"  Physical Exam:    VS:  BP 120/70   Pulse 88   Ht 5\' 3"  (1.6 m)   Wt (!) 307 lb 6.4 oz (139.4 kg)   SpO2 97%   BMI 54.45 kg/m     Wt Readings from Last 3 Encounters:  03/23/23 (!) 307 lb 6.4 oz (139.4 kg)  12/28/22 288 lb (130.6 kg)  02/26/22 275 lb (124.7 kg)     GEN: Obese BMI exceeds 50 well nourished, well developed in no acute distress She has marked tenderness CCTA bilaterally reproducing her chest pain syndrome HEENT: Normal NECK: No JVD; No carotid bruits LYMPHATICS: No lymphadenopathy CARDIAC: RRR, no murmurs, rubs, gallops RESPIRATORY:  Clear to auscultation without rales, wheezing or rhonchi   ABDOMEN: Soft, non-tender, non-distended MUSCULOSKELETAL:  No edema; No deformity  SKIN: Warm and dry NEUROLOGIC:  Alert and oriented x 3 PSYCHIATRIC:  Normal affect    Signed, Norman Herrlich, MD  03/23/2023 4:57 PM    [facility]

## 2023-03-19 ENCOUNTER — Other Ambulatory Visit: Payer: Self-pay

## 2023-03-19 DIAGNOSIS — D573 Sickle-cell trait: Secondary | ICD-10-CM | POA: Insufficient documentation

## 2023-03-19 DIAGNOSIS — R5383 Other fatigue: Secondary | ICD-10-CM | POA: Insufficient documentation

## 2023-03-19 DIAGNOSIS — N6310 Unspecified lump in the right breast, unspecified quadrant: Secondary | ICD-10-CM | POA: Insufficient documentation

## 2023-03-19 DIAGNOSIS — R519 Headache, unspecified: Secondary | ICD-10-CM | POA: Insufficient documentation

## 2023-03-19 DIAGNOSIS — R002 Palpitations: Secondary | ICD-10-CM | POA: Insufficient documentation

## 2023-03-19 DIAGNOSIS — F40240 Claustrophobia: Secondary | ICD-10-CM | POA: Insufficient documentation

## 2023-03-23 ENCOUNTER — Ambulatory Visit: Payer: Federal, State, Local not specified - PPO | Attending: Cardiology | Admitting: Cardiology

## 2023-03-23 ENCOUNTER — Encounter: Payer: Self-pay | Admitting: Cardiology

## 2023-03-23 VITALS — BP 120/70 | HR 88 | Ht 63.0 in | Wt 307.4 lb

## 2023-03-23 DIAGNOSIS — R072 Precordial pain: Secondary | ICD-10-CM

## 2023-03-23 DIAGNOSIS — R002 Palpitations: Secondary | ICD-10-CM

## 2023-03-23 MED ORDER — CELECOXIB 100 MG PO CAPS: 100.0000 mg | ORAL_CAPSULE | Freq: Two times a day (BID) | ORAL | 2 refills | Status: AC

## 2023-03-23 NOTE — Patient Instructions (Addendum)
 Medication Instructions:  Your physician has recommended you make the following change in your medication:   START: Celebrex 100 mg two times daily  *If you need a refill on your cardiac medications before your next appointment, please call your pharmacy*   Lab Work: None If you have labs (blood work) drawn today and your tests are completely normal, you will receive your results only by: MyChart Message (if you have MyChart) OR A paper copy in the mail If you have any lab test that is abnormal or we need to change your treatment, we will call you to review the results.   Testing/Procedures: Your physician has requested that you have an echocardiogram. Echocardiography is a painless test that uses sound waves to create images of your heart. It provides your doctor with information about the size and shape of your heart and how well your heart's chambers and valves are working. This procedure takes approximately one hour. There are no restrictions for this procedure. Please do NOT wear cologne, perfume, aftershave, or lotions (deodorant is allowed). Please arrive 15 minutes prior to your appointment time.    Please note: We ask at that you not bring children with you during ultrasound (echo/ vascular) testing. Due to room size and safety concerns, children are not allowed in the ultrasound rooms during exams. Our front office staff cannot provide observation of children in our lobby area while testing is being conducted. An adult accompanying a patient to their appointment will only be allowed in the ultrasound room at the discretion of the ultrasound technician under special circumstances. We apologize for any inconvenience.   Follow-Up: At [facility], you and your health needs are our priority.  As part of our continuing mission to provide you with exceptional heart care, we have created designated Provider Care Teams.  These Care Teams include your primary Cardiologist  (physician) and Advanced Practice Providers (APPs -  Physician Assistants and Nurse Practitioners) who all work together to provide you with the care you need, when you need it.  We recommend signing up for the patient portal called "MyChart".  Sign up information is provided on this After Visit Summary.  MyChart is used to connect with patients for Virtual Visits (Telemedicine).  Patients are able to view lab/test results, encounter notes, upcoming appointments, etc.  Non-urgent messages can be sent to your provider as well.   To learn more about what you can do with MyChart, go to [email or web address].    Your next appointment:   3 month(s)  Provider:   Norman Herrlich, MD    Other Instructions None

## 2023-04-14 ENCOUNTER — Ambulatory Visit: Payer: Federal, State, Local not specified - PPO | Attending: Cardiology

## 2023-04-14 DIAGNOSIS — R002 Palpitations: Secondary | ICD-10-CM | POA: Diagnosis not present

## 2023-04-14 LAB — ECHOCARDIOGRAM COMPLETE
AR max vel: 2.05 cm2
AV Area VTI: 2.26 cm2
AV Area mean vel: 2.15 cm2
AV Mean grad: 4.8 mmHg
AV Peak grad: 10.2 mmHg
Ao pk vel: 1.6 m/s
Area-P 1/2: 4.89 cm2
S' Lateral: 3.2 cm

## 2023-05-29 ENCOUNTER — Emergency Department (HOSPITAL_BASED_OUTPATIENT_CLINIC_OR_DEPARTMENT_OTHER): Admission: EM | Admit: 2023-05-29 | Discharge: 2023-05-30 | Disposition: A | Discharge status: Home / Self Care

## 2023-05-29 ENCOUNTER — Encounter (HOSPITAL_BASED_OUTPATIENT_CLINIC_OR_DEPARTMENT_OTHER): Payer: Self-pay | Admitting: Emergency Medicine

## 2023-05-29 ENCOUNTER — Emergency Department (HOSPITAL_BASED_OUTPATIENT_CLINIC_OR_DEPARTMENT_OTHER)

## 2023-05-29 ENCOUNTER — Other Ambulatory Visit: Payer: Self-pay

## 2023-05-29 DIAGNOSIS — R109 Unspecified abdominal pain: Secondary | ICD-10-CM

## 2023-05-29 DIAGNOSIS — R350 Frequency of micturition: Secondary | ICD-10-CM | POA: Insufficient documentation

## 2023-05-29 DIAGNOSIS — R1030 Lower abdominal pain, unspecified: Secondary | ICD-10-CM | POA: Diagnosis present

## 2023-05-29 DIAGNOSIS — Z91040 Latex allergy status: Secondary | ICD-10-CM | POA: Diagnosis not present

## 2023-05-29 LAB — COMPREHENSIVE METABOLIC PANEL WITH GFR
ALT: 14 U/L (ref 0–44)
AST: 16 U/L (ref 15–41)
Albumin: 3.9 g/dL (ref 3.5–5.0)
Alkaline Phosphatase: 89 U/L (ref 38–126)
Anion gap: 14 (ref 5–15)
BUN: 12 mg/dL (ref 6–20)
CO2: 20 mmol/L — ABNORMAL LOW (ref 22–32)
Calcium: 9.5 mg/dL (ref 8.9–10.3)
Chloride: 104 mmol/L (ref 98–111)
Creatinine, Ser: 0.67 mg/dL (ref 0.44–1.00)
GFR, Estimated: >60 mL/min (ref >60)
Glucose, Bld: 96 mg/dL (ref 70–99)
Potassium: 3.8 mmol/L (ref 3.5–5.1)
Sodium: 138 mmol/L (ref 135–145)
Total Bilirubin: 0.3 mg/dL (ref 0.0–1.2)
Total Protein: 7.5 g/dL (ref 6.5–8.1)

## 2023-05-29 LAB — URINALYSIS, ROUTINE W REFLEX MICROSCOPIC
Bilirubin Urine: NEGATIVE
Glucose, UA: NEGATIVE mg/dL
Ketones, ur: NEGATIVE mg/dL
Leukocytes,Ua: NEGATIVE
Nitrite: NEGATIVE
Protein, ur: NEGATIVE mg/dL
Specific Gravity, Urine: >=1.03 (ref 1.005–1.030)
pH: 5.5 (ref 5.0–8.0)

## 2023-05-29 LAB — CBC
HCT: 36.5 % (ref 36.0–46.0)
Hemoglobin: 13.3 g/dL (ref 12.0–15.0)
MCH: 28 pg (ref 26.0–34.0)
MCHC: 36.4 g/dL — ABNORMAL HIGH (ref 30.0–36.0)
MCV: 76.8 fL — ABNORMAL LOW (ref 80.0–100.0)
Platelets: 354 10*3/uL (ref 150–400)
RBC: 4.75 MIL/uL (ref 3.87–5.11)
RDW: 14 % (ref 11.5–15.5)
WBC: 7.6 10*3/uL (ref 4.0–10.5)
nRBC: 0 % (ref 0.0–0.2)

## 2023-05-29 LAB — URINALYSIS, MICROSCOPIC (REFLEX)

## 2023-05-29 LAB — PREGNANCY, URINE: Preg Test, Ur: NEGATIVE

## 2023-05-29 LAB — LIPASE, BLOOD: Lipase: 23 U/L (ref 11–51)

## 2023-05-29 NOTE — ED Notes (Signed)
 Patient transported to CT

## 2023-05-29 NOTE — ED Notes (Signed)
 Warm blankets given. EDP at bedside.

## 2023-05-29 NOTE — ED Provider Notes (Incomplete)
  [facility] Provider Note   CSN: [id number] Arrival date & time: 05/29/23  2124     History {Add pertinent medical, surgical, social history, OB history to HPI:1} Chief Complaint  Patient presents with   Abdominal Pain    Gloria Yu is a 38 y.o. female.denies nausea, vomting, vaginal discharge. Coughing. Hematuria, hematochezia.    Abdominal Pain      Home Medications Prior to Admission medications   Medication Sig Start Date End Date Taking? Authorizing Provider  acetaminophen  (TYLENOL ) 500 MG tablet Take 1,000 mg by mouth every 6 (six) hours as needed for mild pain (pain score 1-3) or moderate pain (pain score 4-6).    [provider]  buPROPion (WELLBUTRIN XL) 150 MG 24 hr tablet Take 150 mg by mouth daily.    [provider]  celecoxib  (CELEBREX ) 100 MG capsule Take 1 capsule (100 mg total) by mouth 2 (two) times daily. 03/23/23   Hassan Links, MD  hydrOXYzine (ATARAX) 25 MG tablet Take 25 mg by mouth as needed for anxiety.    [provider]      Allergies    Shellfish allergy and Latex    Review of Systems   Review of Systems  Gastrointestinal:  Positive for abdominal pain.    Physical Exam Updated Vital Signs BP (!) 145/91 (BP Location: Left Arm)   Pulse 91   Temp 98 F (36.7 C) (Oral)   Resp 16   Ht 5\' 3"  (1.6 m)   Wt (!) 138.3 kg   SpO2 98%   BMI 54.03 kg/m  Physical Exam  ED Results / Procedures / Treatments   Labs (all labs ordered are listed, but only abnormal results are displayed) Labs Reviewed  CBC - Abnormal; Notable for the following components:      Result Value   MCV 76.8 (*)    MCHC 36.4 (*)    All other components within normal limits  URINALYSIS, ROUTINE W REFLEX MICROSCOPIC - Abnormal; Notable for the following components:   Hgb urine dipstick TRACE (*)    All other components within normal limits  URINALYSIS, MICROSCOPIC (REFLEX) - Abnormal; Notable  for the following components:   Bacteria, UA RARE (*)    All other components within normal limits  PREGNANCY, URINE  LIPASE, BLOOD  COMPREHENSIVE METABOLIC PANEL WITH GFR    EKG None  Radiology No results found.  Procedures Procedures  {Document cardiac monitor, telemetry assessment procedure when appropriate:1}  Medications Ordered in ED Medications - No data to display  ED Course/ Medical Decision Making/ A&P   {   Click here for ABCD2, HEART and other calculatorsREFRESH Note before signing :1}                              Medical Decision Making Amount and/or Complexity of Data Reviewed Labs: ordered.   ***  {Document critical care time when appropriate:1} {Document review of labs and clinical decision tools ie heart score, Chads2Vasc2 etc:1}  {Document your independent review of radiology images, and any outside records:1} {Document your discussion with family members, caretakers, and with consultants:1} {Document social determinants of health affecting pt's care:1} {Document your decision making why or why not admission, treatments were needed:1} Final Clinical Impression(s) / ED Diagnoses Final diagnoses:  None    Rx / DC Orders ED Discharge Orders     None

## 2023-05-29 NOTE — ED Triage Notes (Signed)
 Pt POV c/o lower abd pain, sharp R flank pain, increased urinary frequency since Monday.    Reports diarrhea yesterday. Denies fever.

## 2023-05-30 MED ORDER — ACETAMINOPHEN 500 MG PO TABS: 1000.0000 mg | ORAL_TABLET | Freq: Once | ORAL | Status: DC

## 2023-05-30 MED ORDER — ONDANSETRON 4 MG PO TBDP: 4.0000 mg | ORAL_TABLET | Freq: Once | ORAL | Status: DC

## 2023-05-30 MED ORDER — ONDANSETRON HCL 4 MG/2ML IJ SOLN
4.0000 mg | Freq: Once | INTRAMUSCULAR | Status: AC
Start: 2023-05-30 — End: 2023-05-30
  Administered 2023-05-30: 4 mg via INTRAVENOUS
  Filled 2023-05-30: qty 2

## 2023-05-30 MED ORDER — MORPHINE SULFATE (PF) 4 MG/ML IV SOLN
4.0000 mg | Freq: Once | INTRAVENOUS | Status: AC
  Administered 2023-05-30: 4 mg via INTRAVENOUS
  Filled 2023-05-30: qty 1

## 2023-05-30 MED ORDER — IOHEXOL 300 MG/ML  SOLN
100.0000 mL | Freq: Once | INTRAMUSCULAR | Status: AC | PRN
  Administered 2023-05-30: 100 mL via INTRAVENOUS

## 2023-05-30 MED ORDER — DIPHENHYDRAMINE HCL 50 MG/ML IJ SOLN
25.0000 mg | Freq: Once | INTRAMUSCULAR | Status: AC
  Administered 2023-05-30: 25 mg via INTRAVENOUS
  Filled 2023-05-30: qty 1

## 2023-05-30 MED ORDER — SODIUM CHLORIDE 0.9 % IV BOLUS
500.0000 mL | Freq: Once | INTRAVENOUS | Status: AC
  Administered 2023-05-30: 500 mL via INTRAVENOUS

## 2023-05-30 NOTE — ED Notes (Signed)
 PT began to systemically itch after morphine. EDP at bedside. No concerns with throat of breathing.

## 2023-05-30 NOTE — Discharge Instructions (Signed)

## 2023-06-19 ENCOUNTER — Ambulatory Visit

## 2023-06-23 ENCOUNTER — Ambulatory Visit: Payer: Federal, State, Local not specified - PPO | Admitting: Cardiology

## 2023-06-29 IMAGING — US US EXTREM LOW VENOUS*L*
1 series · 14 of 24 positions shown · non-contrast
Comparison: 09/13/2019

CLINICAL DATA: Left leg cramping

EXAM:
Left LOWER EXTREMITY VENOUS DOPPLER ULTRASOUND
TECHNIQUE: Gray-scale sonography with compression, as well as color and duplex
ultrasound, were performed to evaluate the deep venous system(s)
from the level of the common femoral vein through the popliteal and
proximal calf veins.

[Series 1: us extrem low venous*left* · 14 of 30 slices shown]
[im 1/30]
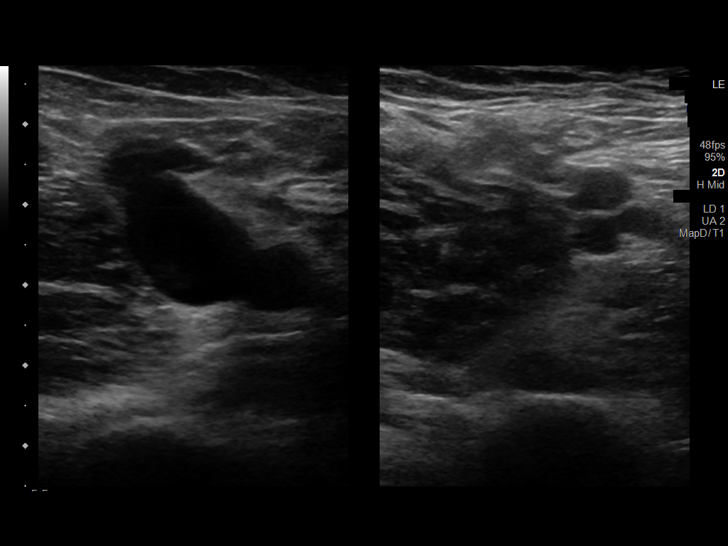
[im 3/30]
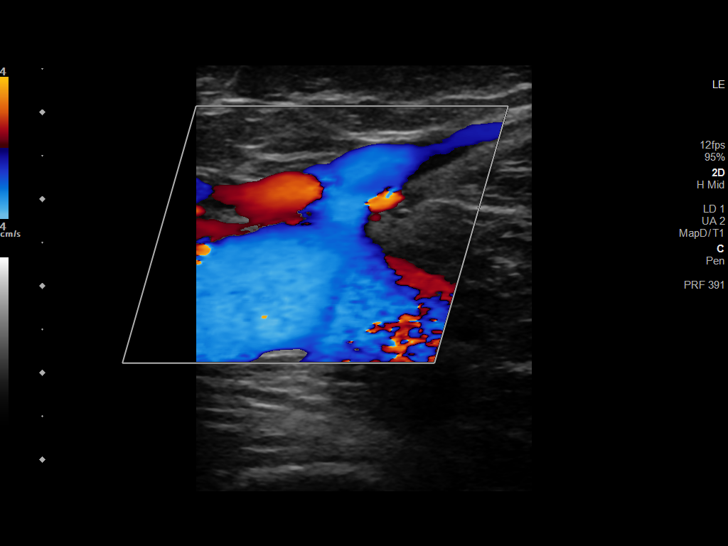
[im 6/30]
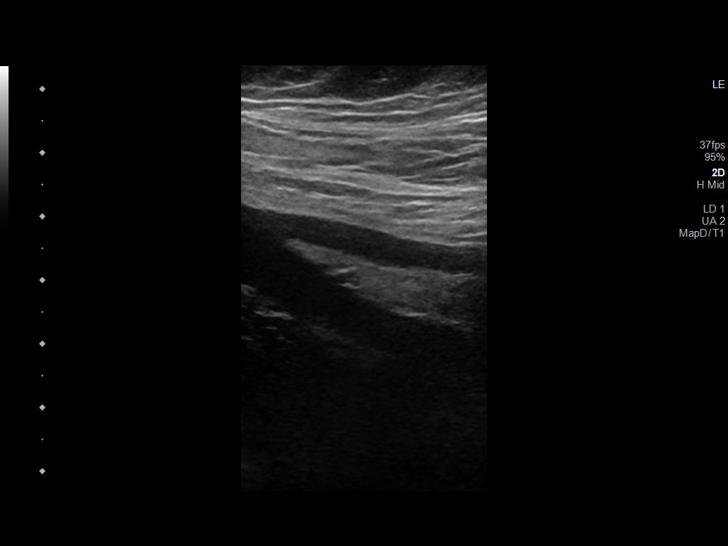
[im 8/30]
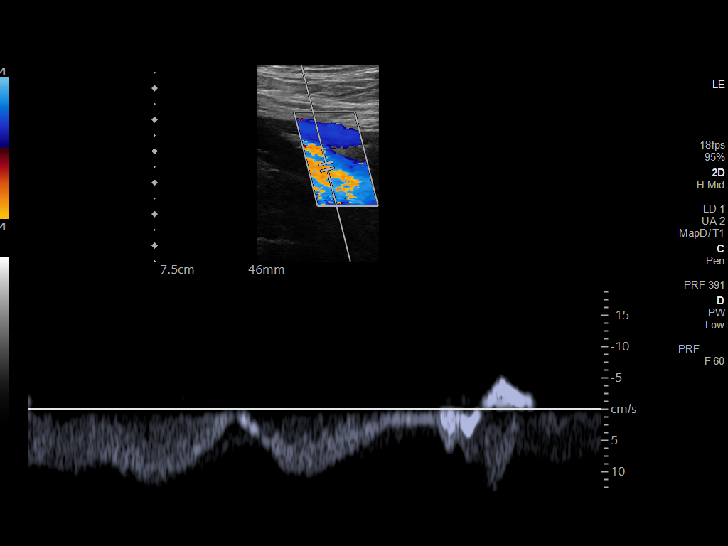
[im 9/30]
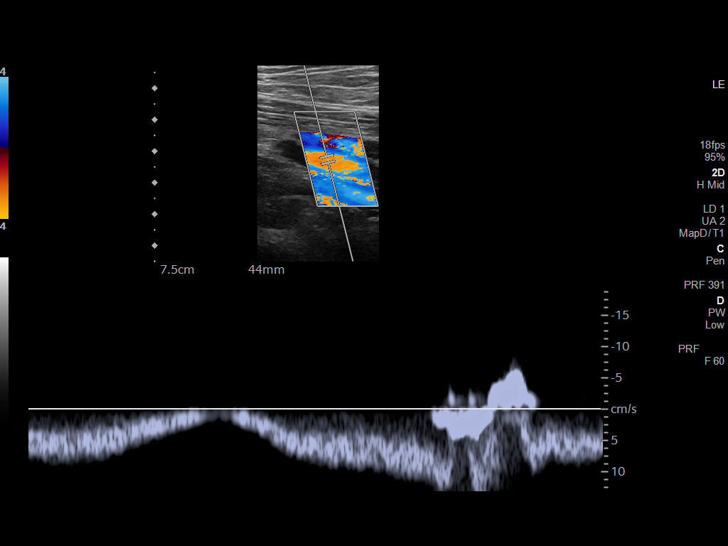
[im 12/30]
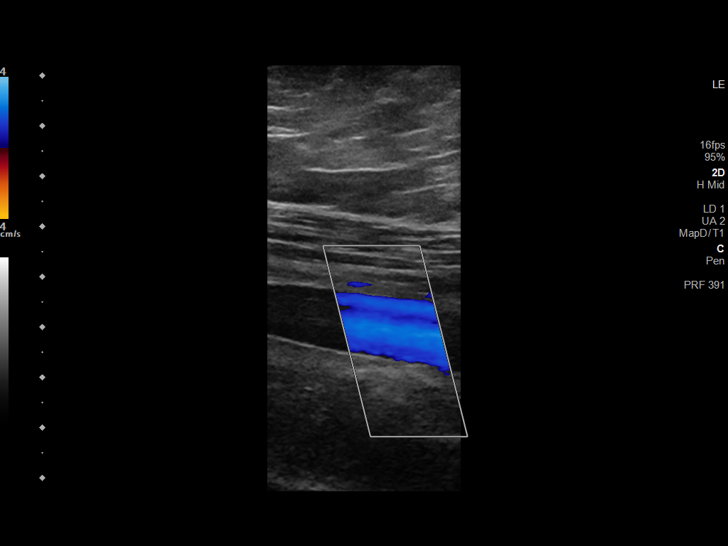
[im 14/30]
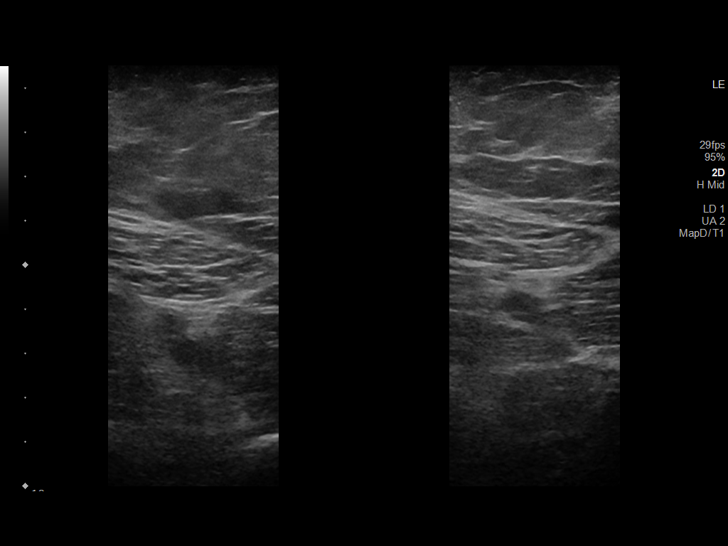
[im 16/30]
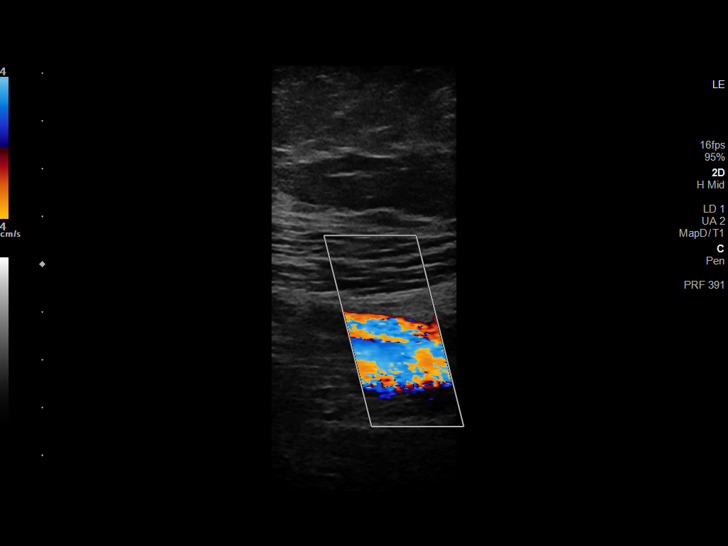
[im 18/30]
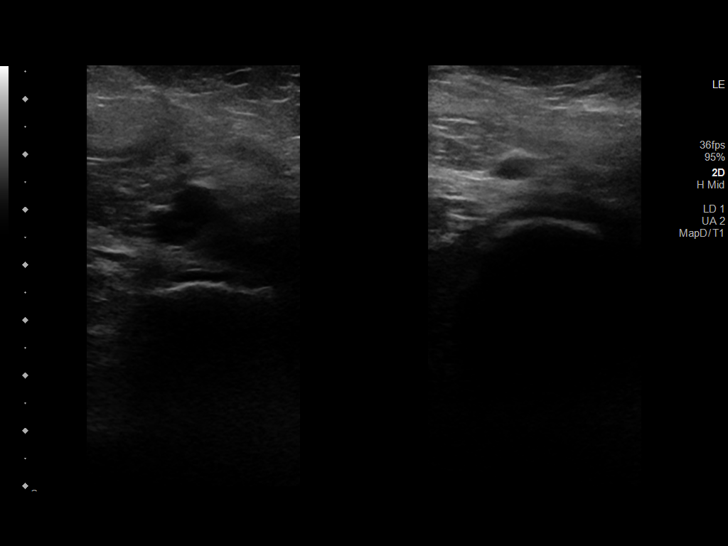
[im 21/30]
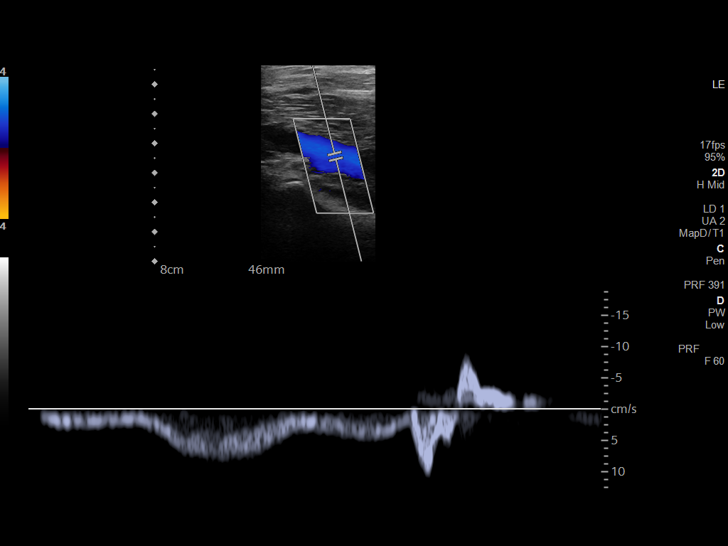
[im 23/30]
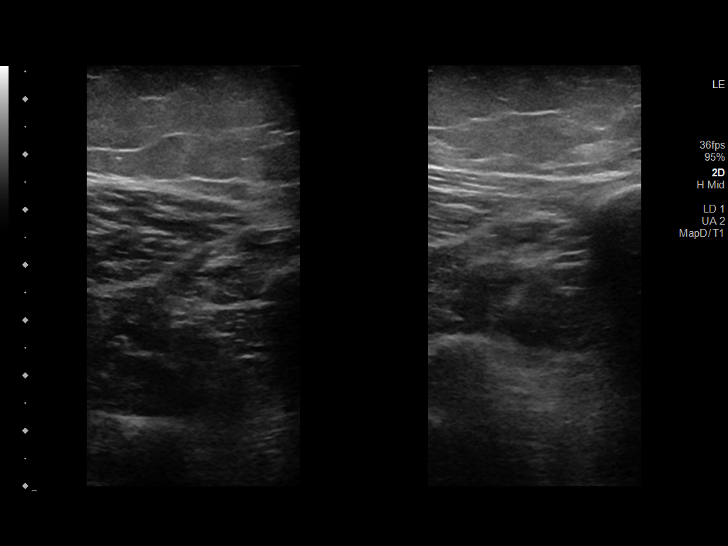
[im 24/30]
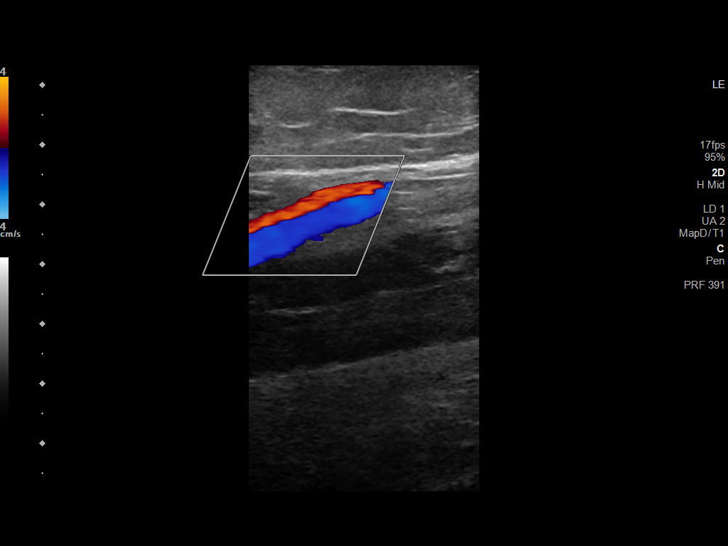
[im 27/30]
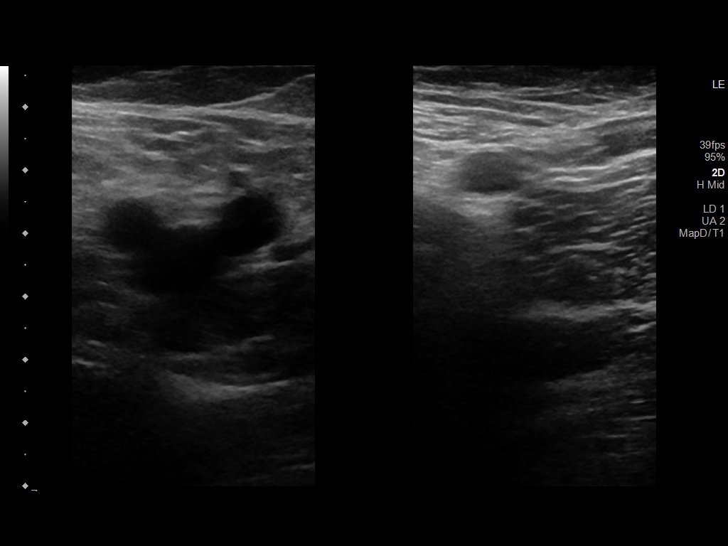
[im 30/30]
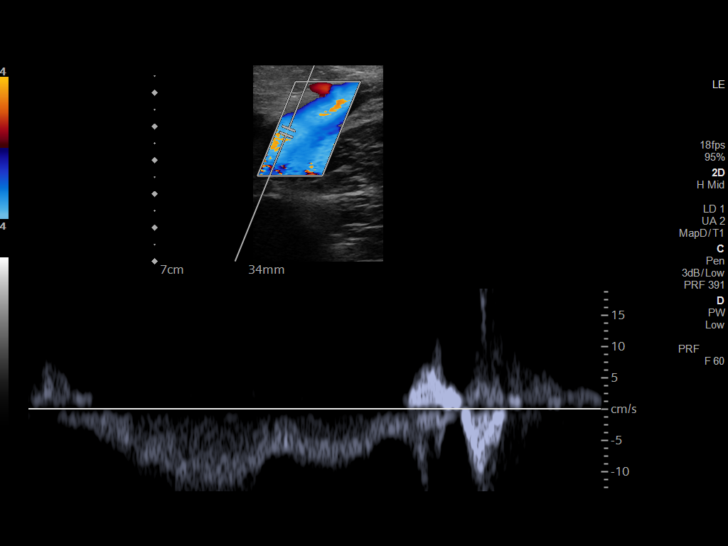

[14 of 24 positions shown; findings below may reference images not displayed]

FINDINGS: VENOUS

Normal compressibility of the common femoral, superficial femoral,
and popliteal veins, as well as the visualized calf veins.
Visualized portions of profunda femoral vein and great saphenous
vein unremarkable. No filling defects to suggest DVT on grayscale or
color Doppler imaging. Doppler waveforms show normal direction of
venous flow, normal respiratory plasticity and response to
augmentation.

Limited views of the contralateral common femoral vein are
unremarkable.

OTHER

None.

Limitations: none
IMPRESSION: Negative.

## 2023-06-29 IMAGING — CR DG CHEST 2V
2 series · 2 of 2 positions shown · non-contrast
Comparison: September 28, 2020

CLINICAL DATA: Chest pain

EXAM:
CHEST - 2 VIEW

[w chest pa]
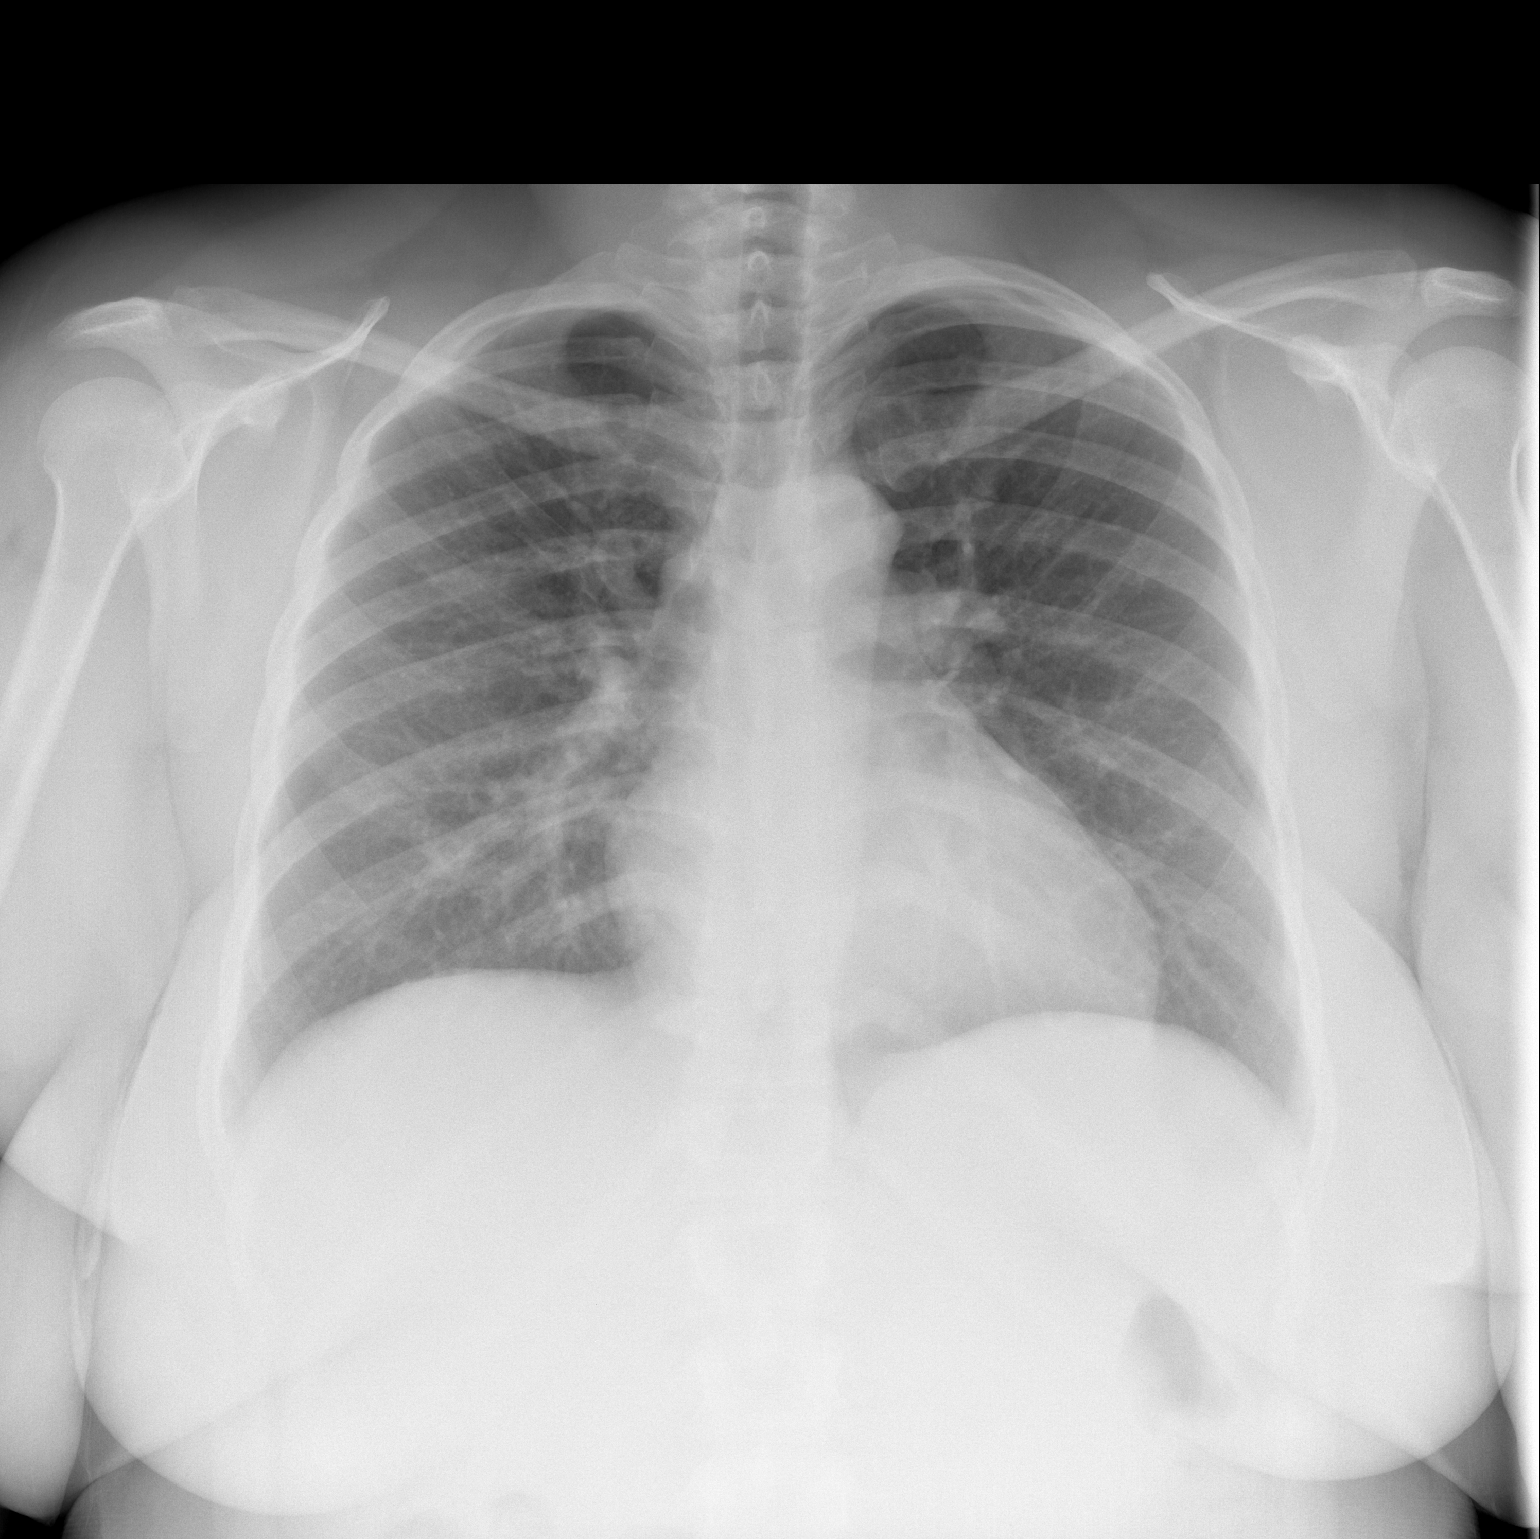

[w chest lat]
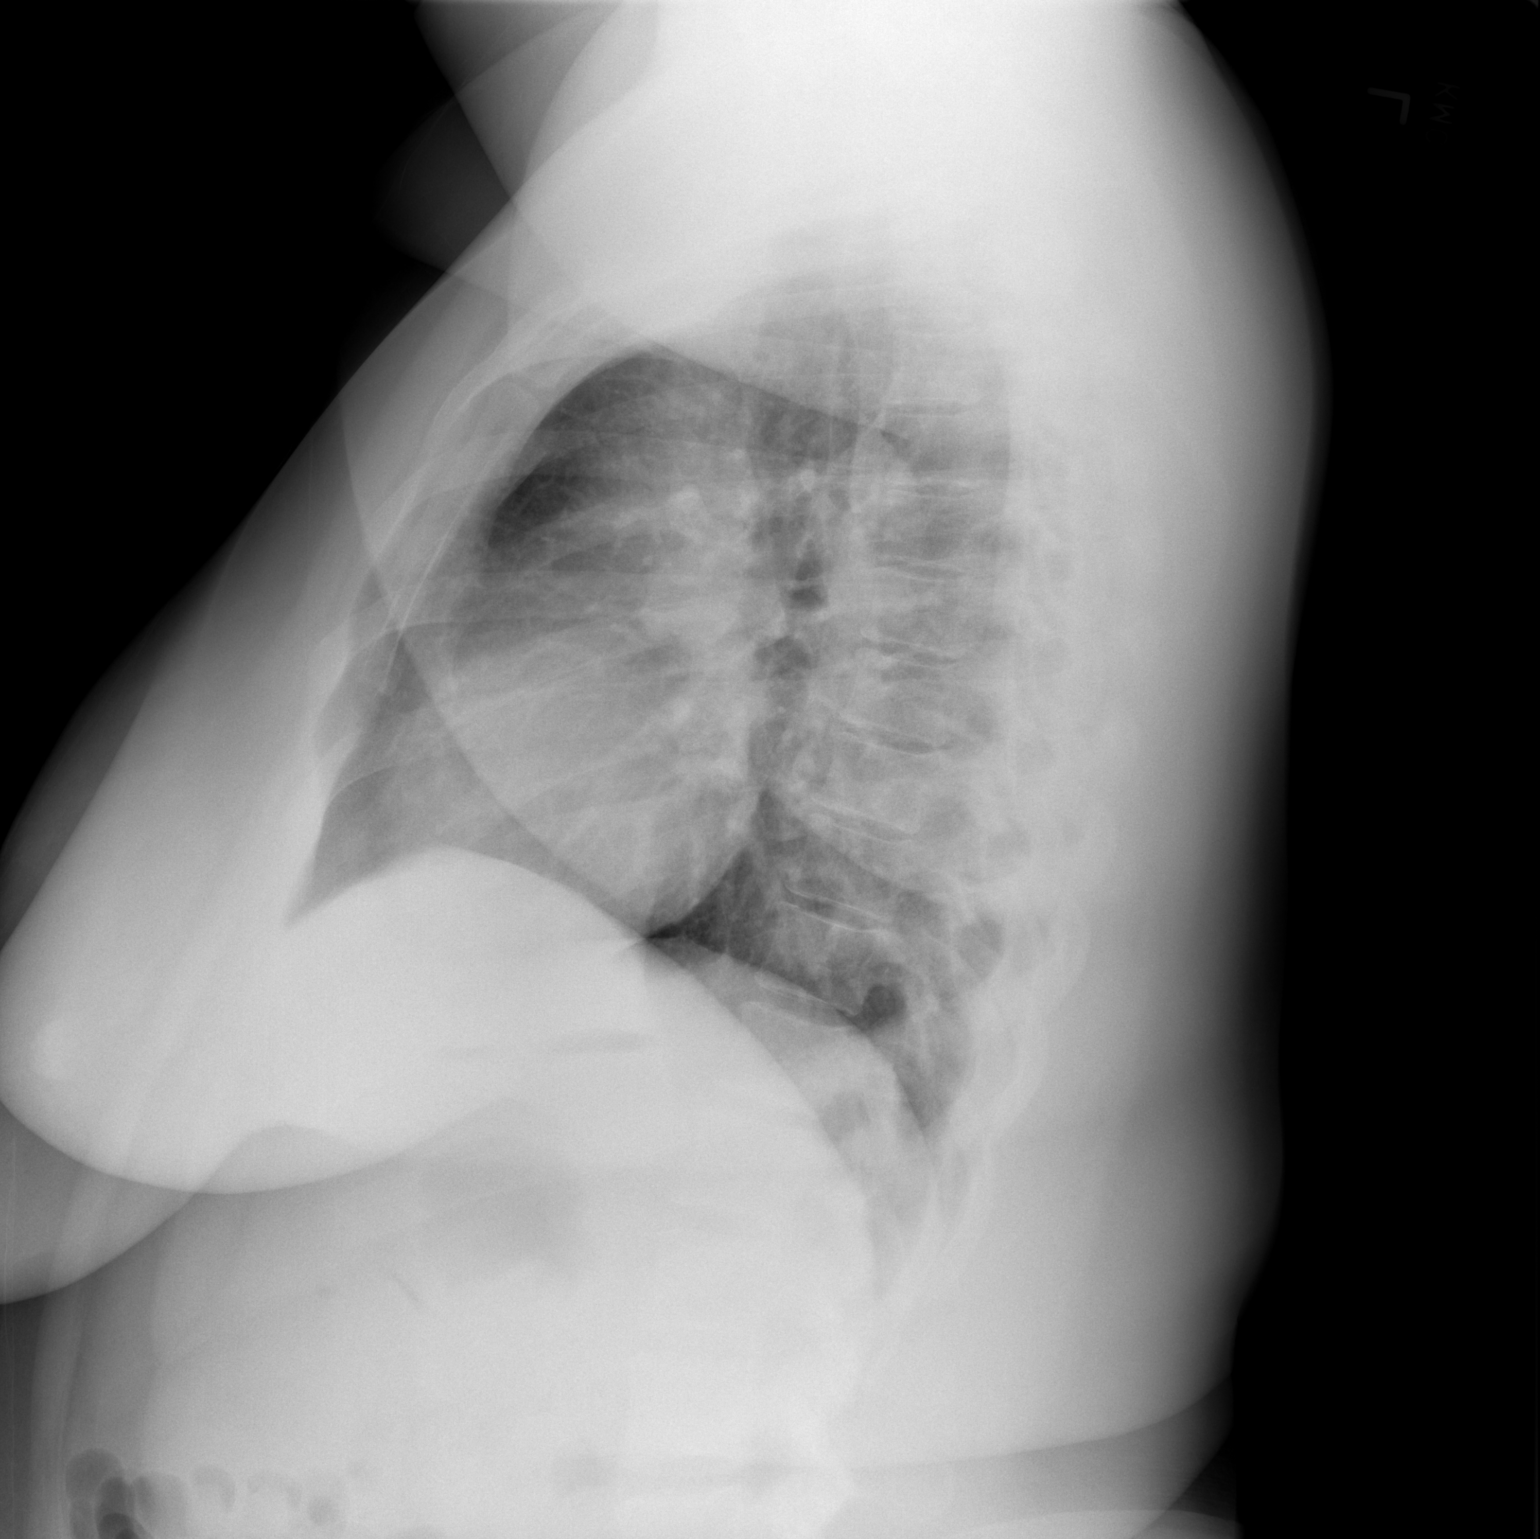

[2 of 2 positions shown; findings below may reference images not displayed]

FINDINGS: The heart size and mediastinal contours are within normal limits.
Both lungs are clear. The visualized skeletal structures are
unremarkable.
IMPRESSION: No active cardiopulmonary disease.

## 2023-07-07 ENCOUNTER — Ambulatory Visit

## 2023-11-17 ENCOUNTER — Other Ambulatory Visit (HOSPITAL_BASED_OUTPATIENT_CLINIC_OR_DEPARTMENT_OTHER): Payer: Self-pay

## 2023-11-17 ENCOUNTER — Encounter (HOSPITAL_BASED_OUTPATIENT_CLINIC_OR_DEPARTMENT_OTHER): Payer: Self-pay

## 2023-11-17 ENCOUNTER — Ambulatory Visit (HOSPITAL_BASED_OUTPATIENT_CLINIC_OR_DEPARTMENT_OTHER)
Admission: EM | Admit: 2023-11-17 | Discharge: 2023-11-17 | Disposition: A | Discharge status: Home / Self Care | Attending: Family Medicine | Admitting: Family Medicine

## 2023-11-17 ENCOUNTER — Ambulatory Visit (HOSPITAL_BASED_OUTPATIENT_CLINIC_OR_DEPARTMENT_OTHER): Admit: 2023-11-17 | Discharge: 2023-11-17 | Disposition: A | Admitting: Radiology

## 2023-11-17 DIAGNOSIS — M25512 Pain in left shoulder: Secondary | ICD-10-CM

## 2023-11-17 MED ORDER — PREDNISONE 20 MG PO TABS
40.0000 mg | ORAL_TABLET | Freq: Every day | ORAL | 0 refills | Status: AC
  Filled 2023-11-17: qty 10, 5d supply, fill #0

## 2023-11-17 MED ORDER — TRIAMCINOLONE ACETONIDE 40 MG/ML IJ SUSP
40.0000 mg | Freq: Once | INTRAMUSCULAR | Status: AC
  Administered 2023-11-17: 40 mg via INTRAMUSCULAR

## 2023-11-17 NOTE — Discharge Instructions (Signed)
 You have degenerative changes of the [facility] joint in the shoulder. Similar to osteoarthritis. This may flare up from time to time. I would recommend see the orthopedist if this continues. We have given you a steroid injection here today which should help. I am prescribing a steroid to start tomorrow in the morning with food.  You can take extra strength tylenol  as needed for pain. Do not take any ibuprofen  while taking the prednisone . May increase risk for GI upset and bleeding.  Recommend ice to the area 3-4 time a day for 15-20 minutes.

## 2023-11-17 NOTE — ED Provider Notes (Signed)
 [facility]    CSN: [id number] Arrival date & time: 11/17/23  0854      History   Chief Complaint Chief Complaint  Patient presents with   Shoulder Pain    HPI Gloria Yu is a 38 y.o. female.   Pt c/o left shoulder pain off and on for the last 2-3 weeks. Pt denies known injury. She has been taking ibuprofen  and tylenol  as needed with no relief. Pain radiated to her left upper back. Due to the pain she has been trying to sleep on her back and does not think she has been laying on her left arm. She feels like she can not move her arm freely without increasing the pain.     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  Patient needs pap postpartum     Claustrophobia    Cyst of breast 09/26/2019   Formatting of this note might be different from the original.  Hx excision breast cyst at age 38 yrs     Fatigue    Headache    migraines   Hx of abnormal cervical Pap smear 09/26/2019   Intramural leiomyoma of uterus 05/15/2019   Irregular bleeding 02/17/2019   IUD (intrauterine device) in place 03/17/2019   Formatting of this note might be different from the original.  Mirena  IUD inserted 03-08-19     Lump of breast, right    removed at age 24   Other specified postprocedural states 07/05/2015   Formatting of this note might be different from the original.  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 Repeat 1 yr  Formatting of this note might be different from the original.  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   Cyst of breast 09/26/2019   Hx of abnormal cervical Pap smear 09/26/2019   Intramural leiomyoma of uterus 05/15/2019   IUD (intrauterine device) in place 03/17/2019   Irregular bleeding 02/17/2019   Ankle contracture, left 12/10/2018   Plantar fasciitis of left foot 12/10/2018   SVD (spontaneous vaginal delivery) 08/02/2017   Premature rupture of membranes 07/30/2017   BMI 50.0-59.9, adult (HCC) 05/29/2017   Cervical insufficiency during pregnancy in second trimester, antepartum 03/27/2017   Acute medial meniscus tear of right knee 11/05/2016   Missed abortion 06/03/2016   Other specified postprocedural states 07/05/2015   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    Past Surgical History:  Procedure Laterality Date   BREAST LUMPECTOMY Right age 74   benign   KNEE ARTHROSCOPY WITH MEDIAL MENISECTOMY Right 11/05/2016   Procedure: RIGHT KNEE ARTHROSCOPY WITH PARTIAL MEDIAL MENISCUS;  Surgeon:  Sharl Selinda Dover, MD;  Location: [facility];  Service: Orthopedics;  Laterality: Right;  90 mins    OB History     Gravida  3   Para  1   Term  1   Preterm      AB  1   Living  1      SAB  1   IAB      Ectopic      Multiple      Live Births  1            Home Medications    Prior to Admission medications   Medication Sig Start Date End Date Taking? Authorizing Provider  predniSONE  (DELTASONE ) 20 MG tablet Take 2 tablets (40 mg total) by mouth daily with breakfast for 5 days. 11/17/23 11/22/23 Yes Soley Harriss A, FNP  acetaminophen  (TYLENOL ) 500 MG tablet Take 1,000 mg by mouth every 6 (six) hours as needed for mild pain  (pain score 1-3) or moderate pain (pain score 4-6).    [provider]  buPROPion (WELLBUTRIN XL) 150 MG 24 hr tablet Take 150 mg by mouth daily.    [provider]  celecoxib  (CELEBREX ) 100 MG capsule Take 1 capsule (100 mg total) by mouth 2 (two) times daily. 03/23/23   Monetta Redell PARAS, MD  hydrOXYzine (ATARAX) 25 MG tablet Take 25 mg by mouth as needed for anxiety.    [provider]    Family History Family History  Problem Relation Age of Onset   Hypertension Mother    Diabetes Mother    Heart disease Father    Hypertension Father    Cancer Father     Social History Social History   Tobacco Use   Smoking status: Never   Smokeless tobacco: Never  Vaping Use   Vaping status: Never Used  Substance Use Topics   Alcohol use: Not Currently    Comment: occ   Drug use: No     Allergies   Shellfish allergy and Latex   Review of Systems Review of Systems See HPI  Physical Exam Triage Vital Signs ED Triage Vitals  Encounter Vitals Group     BP 11/17/23 0907 (!) 136/90     Girls Systolic BP Percentile --      Girls Diastolic BP Percentile --      Boys Systolic BP Percentile --      Boys Diastolic BP Percentile --      Pulse Rate 11/17/23 0907 76     Resp 11/17/23 0907 20     Temp 11/17/23 0907 98 F (36.7 C)     Temp Source 11/17/23 0907 Oral     SpO2 11/17/23 0907 97 %     Weight --      Height --      Head Circumference --      Peak Flow --      Pain Score 11/17/23 0905 8     Pain Loc --      Pain Education --      Exclude from Growth Chart --    No data found.  Updated Vital Signs BP (!) 136/90 (BP Location: Right Arm)   Pulse 76   Temp 98 F (36.7 C) (Oral)   Resp 20   LMP 11/17/2023 (Exact Date)   SpO2 97%   Visual Acuity Right Eye Distance:   Left Eye Distance:   Bilateral Distance:    Right Eye Near:   Left  Eye Near:    Bilateral Near:     Physical Exam Vitals and nursing note reviewed.  Constitutional:       General: She is not in acute distress.    Appearance: Normal appearance. She is not ill-appearing, toxic-appearing or diaphoretic.  Pulmonary:     Effort: Pulmonary effort is normal.  Musculoskeletal:        General: Tenderness present.     Comments: TTP anterior and posterior shoulder at the joint. Limited ROM  Neurological:     Mental Status: She is alert.  Psychiatric:        Mood and Affect: Mood normal.      UC Treatments / Results  Labs (all labs ordered are listed, but only abnormal results are displayed) Labs Reviewed - No data to display  EKG   Radiology DG Shoulder Left Result Date: 11/17/2023 CLINICAL DATA:  Shoulder pain 2-3 weeks.  No injury. EXAM: LEFT SHOULDER - 2+ VIEW COMPARISON:  02/26/2022 FINDINGS: Minimal degenerative change of the [facility] joint. Glenohumeral joint is normal. No acute fracture or dislocation. IMPRESSION: 1. No acute findings. 2. Minimal degenerative change of the [facility] joint. Electronically Signed   By: Toribio Agreste M.D.   On: 11/17/2023 10:01    Procedures Procedures (including critical care time)  Medications Ordered in UC Medications  triamcinolone  acetonide (KENALOG-40) injection 40 mg (40 mg Intramuscular Given 11/17/23 1012)    Initial Impression / Assessment and Plan / UC Course  I have reviewed the triage vital signs and the nursing notes.  Pertinent labs & imaging results that were available during my care of the patient were reviewed by me and considered in my medical decision making (see chart for details).     Left shoulder pain- xray with minimal degenerative changes of the joint. Believe this could be the cause of her pain. No new injuries. Steroid injection given here for pain and inflammation. Start prednisone  tomorrow with food. Extra strength tylenol  as needed.   Ice to the shoulder and rest. Follow up with orthopedics for continued issues.  Final Clinical Impressions(s) / UC Diagnoses   Final diagnoses:  Pain in  joint of left shoulder     Discharge Instructions      You have degenerative changes of the [facility] joint in the shoulder. Similar to osteoarthritis. This may flare up from time to time. I would recommend see the orthopedist if this continues. We have given you a steroid injection here today which should help. I am prescribing a steroid to start tomorrow in the morning with food.  You can take extra strength tylenol  as needed for pain. Do not take any ibuprofen  while taking the prednisone . May increase risk for GI upset and bleeding.  Recommend ice to the area 3-4 time a day for 15-20 minutes.      ED Prescriptions     Medication Sig Dispense Auth. Provider   predniSONE  (DELTASONE ) 20 MG tablet Take 2 tablets (40 mg total) by mouth daily with breakfast for 5 days. 10 tablet Adah Wilbert LABOR, FNP      PDMP not reviewed this encounter.   Adah Wilbert LABOR, FNP 11/17/23 [phone]

## 2023-11-17 NOTE — ED Triage Notes (Signed)
 Pt c/o left shoulder pain off and on for the last 2-3 weeks. Pt denies known injury. She has been taking ibuprofen  and tylenol  as needed with no relief. Pain radiated to her left upper back. Due to the pain she has been trying to sleep on her back and does not think she has been laying on her left arm. She feels like she can not move her arm freely without increasing the pain.

## 2024-01-25 ENCOUNTER — Telehealth: Payer: Self-pay | Admitting: Cardiology

## 2024-01-25 NOTE — Telephone Encounter (Signed)
" ° °  Pt c/o of Chest Pain: STAT if active CP, including tightness, pressure, jaw pain, radiating pain to shoulder/upper arm/back, CP unrelieved by Nitro. Symptoms reported of SOB, nausea, vomiting, sweating.  1. Are you having CP right now? Discomfort from LT shoulder to chest    2. Are you experiencing any other symptoms (ex. SOB, nausea, vomiting, sweating)? Nausea at night   3. Is your CP continuous or coming and going? continuous   4. Have you taken Nitroglycerin? No   5. How long have you been experiencing CP? Last 2 weeks, off and on     6. If NO CP at time of call then end call with telling Pt to call back or call 911 if Chest pain returns prior to return call from triage team.  "

## 2024-01-25 NOTE — Telephone Encounter (Signed)
 Pt states that she has been having shoulder pain for a while and has saw ortho but nothing was wrong. Pt states now she has pain radiating into her chest that is worse when she lays down and relaxes. Pt states that it feels like a bruise when touched. Advised to see PCP as sx sound musculoskeletal while waiting on appointment to see Dr. Liborio. Appointment offered today but she declined and needed a Friday appointment and then cancelled stating she will call her PCP and call us  back if she needs to be seen. Advised to call 911 for MI sx. Pt verbalized understanding and had no additional questions.

## 2024-01-29 ENCOUNTER — Ambulatory Visit
